# Patient Record
Sex: Male | Born: 1950
Health system: Southern US, Community
[De-identification: ages and names within clinical notes are randomized; demographics above are authoritative.]

## PROBLEM LIST (undated history)

## (undated) DIAGNOSIS — I499 Cardiac arrhythmia, unspecified: Secondary | ICD-10-CM

## (undated) DIAGNOSIS — I429 Cardiomyopathy, unspecified: Secondary | ICD-10-CM

## (undated) DIAGNOSIS — I219 Acute myocardial infarction, unspecified: Secondary | ICD-10-CM

## (undated) DIAGNOSIS — I10 Essential (primary) hypertension: Secondary | ICD-10-CM

## (undated) DIAGNOSIS — I509 Heart failure, unspecified: Secondary | ICD-10-CM

## (undated) DIAGNOSIS — Z95810 Presence of automatic (implantable) cardiac defibrillator: Secondary | ICD-10-CM

## (undated) DIAGNOSIS — T4145XA Adverse effect of unspecified anesthetic, initial encounter: Secondary | ICD-10-CM

## (undated) DIAGNOSIS — R0600 Dyspnea, unspecified: Secondary | ICD-10-CM

## (undated) DIAGNOSIS — I2510 Atherosclerotic heart disease of native coronary artery without angina pectoris: Secondary | ICD-10-CM

## (undated) DIAGNOSIS — E119 Type 2 diabetes mellitus without complications: Secondary | ICD-10-CM

## (undated) DIAGNOSIS — Z950 Presence of cardiac pacemaker: Secondary | ICD-10-CM

## (undated) DIAGNOSIS — T8859XA Other complications of anesthesia, initial encounter: Secondary | ICD-10-CM

## (undated) DIAGNOSIS — G4730 Sleep apnea, unspecified: Secondary | ICD-10-CM

## (undated) HISTORY — PX: INSERT / REPLACE / REMOVE PACEMAKER: SUR710

## (undated) HISTORY — PX: ABLATION: SHX5711

## (undated) HISTORY — PX: EYE SURGERY: SHX253

## (undated) HISTORY — PX: RETINAL DETACHMENT SURGERY: SHX105

## (undated) HISTORY — PX: JOINT REPLACEMENT: SHX530

## (undated) HISTORY — PX: CATARACT EXTRACTION: SUR2

## (undated) HISTORY — PX: CORONARY ARTERY BYPASS GRAFT: SHX141

## (undated) HISTORY — PX: FRACTURE SURGERY: SHX138

---

## 2000-10-18 ENCOUNTER — Encounter: Admission: RE | Admit: 2000-10-18 | Discharge: 2000-10-18 | Payer: Self-pay | Admitting: Sports Medicine

## 2000-10-18 ENCOUNTER — Encounter: Payer: Self-pay | Admitting: Sports Medicine

## 2009-08-04 ENCOUNTER — Ambulatory Visit: Payer: Self-pay | Admitting: Ophthalmology

## 2009-08-21 ENCOUNTER — Ambulatory Visit: Payer: Self-pay | Admitting: Family Medicine

## 2010-05-20 ENCOUNTER — Ambulatory Visit: Payer: Self-pay | Admitting: Unknown Physician Specialty

## 2011-05-25 ENCOUNTER — Ambulatory Visit: Payer: Self-pay | Admitting: Unknown Physician Specialty

## 2011-07-01 ENCOUNTER — Ambulatory Visit: Payer: Self-pay | Admitting: Cardiology

## 2014-06-28 ENCOUNTER — Ambulatory Visit: Payer: Self-pay | Admitting: Unknown Physician Specialty

## 2014-06-28 DIAGNOSIS — D122 Benign neoplasm of ascending colon: Secondary | ICD-10-CM | POA: Diagnosis not present

## 2014-06-28 DIAGNOSIS — D123 Benign neoplasm of transverse colon: Secondary | ICD-10-CM | POA: Diagnosis not present

## 2014-06-28 DIAGNOSIS — I10 Essential (primary) hypertension: Secondary | ICD-10-CM | POA: Diagnosis not present

## 2014-06-28 DIAGNOSIS — K635 Polyp of colon: Secondary | ICD-10-CM | POA: Diagnosis not present

## 2014-06-28 DIAGNOSIS — K5730 Diverticulosis of large intestine without perforation or abscess without bleeding: Secondary | ICD-10-CM | POA: Diagnosis not present

## 2014-06-28 DIAGNOSIS — D125 Benign neoplasm of sigmoid colon: Secondary | ICD-10-CM | POA: Diagnosis not present

## 2014-06-28 DIAGNOSIS — K648 Other hemorrhoids: Secondary | ICD-10-CM | POA: Diagnosis not present

## 2014-06-28 DIAGNOSIS — I252 Old myocardial infarction: Secondary | ICD-10-CM | POA: Diagnosis not present

## 2014-06-28 DIAGNOSIS — D120 Benign neoplasm of cecum: Secondary | ICD-10-CM | POA: Diagnosis not present

## 2014-06-28 DIAGNOSIS — Z1211 Encounter for screening for malignant neoplasm of colon: Secondary | ICD-10-CM | POA: Diagnosis not present

## 2014-06-28 DIAGNOSIS — D127 Benign neoplasm of rectosigmoid junction: Secondary | ICD-10-CM | POA: Diagnosis not present

## 2014-07-23 DIAGNOSIS — I252 Old myocardial infarction: Secondary | ICD-10-CM | POA: Diagnosis not present

## 2014-07-23 DIAGNOSIS — I10 Essential (primary) hypertension: Secondary | ICD-10-CM | POA: Diagnosis not present

## 2014-08-05 LAB — SURGICAL PATHOLOGY

## 2014-09-19 DIAGNOSIS — I252 Old myocardial infarction: Secondary | ICD-10-CM | POA: Diagnosis not present

## 2014-09-19 DIAGNOSIS — M5432 Sciatica, left side: Secondary | ICD-10-CM | POA: Diagnosis not present

## 2014-09-19 DIAGNOSIS — E782 Mixed hyperlipidemia: Secondary | ICD-10-CM | POA: Diagnosis not present

## 2014-09-19 DIAGNOSIS — I10 Essential (primary) hypertension: Secondary | ICD-10-CM | POA: Diagnosis not present

## 2014-09-25 DIAGNOSIS — M5432 Sciatica, left side: Secondary | ICD-10-CM | POA: Diagnosis not present

## 2014-09-30 DIAGNOSIS — M5432 Sciatica, left side: Secondary | ICD-10-CM | POA: Diagnosis not present

## 2014-10-03 DIAGNOSIS — M5432 Sciatica, left side: Secondary | ICD-10-CM | POA: Diagnosis not present

## 2014-10-08 DIAGNOSIS — M5432 Sciatica, left side: Secondary | ICD-10-CM | POA: Diagnosis not present

## 2014-10-10 DIAGNOSIS — M5432 Sciatica, left side: Secondary | ICD-10-CM | POA: Diagnosis not present

## 2014-11-06 DIAGNOSIS — I5022 Chronic systolic (congestive) heart failure: Secondary | ICD-10-CM | POA: Diagnosis not present

## 2014-11-06 DIAGNOSIS — I252 Old myocardial infarction: Secondary | ICD-10-CM | POA: Diagnosis not present

## 2014-11-06 DIAGNOSIS — I10 Essential (primary) hypertension: Secondary | ICD-10-CM | POA: Diagnosis not present

## 2014-11-06 DIAGNOSIS — I429 Cardiomyopathy, unspecified: Secondary | ICD-10-CM | POA: Diagnosis not present

## 2014-11-14 DIAGNOSIS — I5022 Chronic systolic (congestive) heart failure: Secondary | ICD-10-CM | POA: Diagnosis not present

## 2014-12-20 DIAGNOSIS — E782 Mixed hyperlipidemia: Secondary | ICD-10-CM | POA: Diagnosis not present

## 2014-12-20 DIAGNOSIS — I10 Essential (primary) hypertension: Secondary | ICD-10-CM | POA: Diagnosis not present

## 2014-12-27 DIAGNOSIS — G4730 Sleep apnea, unspecified: Secondary | ICD-10-CM | POA: Diagnosis not present

## 2014-12-27 DIAGNOSIS — I10 Essential (primary) hypertension: Secondary | ICD-10-CM | POA: Diagnosis not present

## 2014-12-27 DIAGNOSIS — E782 Mixed hyperlipidemia: Secondary | ICD-10-CM | POA: Diagnosis not present

## 2015-03-12 DIAGNOSIS — G4730 Sleep apnea, unspecified: Secondary | ICD-10-CM | POA: Diagnosis not present

## 2015-03-12 DIAGNOSIS — I5022 Chronic systolic (congestive) heart failure: Secondary | ICD-10-CM | POA: Diagnosis not present

## 2015-03-12 DIAGNOSIS — I429 Cardiomyopathy, unspecified: Secondary | ICD-10-CM | POA: Diagnosis not present

## 2015-03-12 DIAGNOSIS — E782 Mixed hyperlipidemia: Secondary | ICD-10-CM | POA: Diagnosis not present

## 2015-03-12 DIAGNOSIS — Z9889 Other specified postprocedural states: Secondary | ICD-10-CM | POA: Diagnosis not present

## 2015-03-12 DIAGNOSIS — I10 Essential (primary) hypertension: Secondary | ICD-10-CM | POA: Diagnosis not present

## 2015-04-25 DIAGNOSIS — Z125 Encounter for screening for malignant neoplasm of prostate: Secondary | ICD-10-CM | POA: Diagnosis not present

## 2015-04-25 DIAGNOSIS — Z0000 Encounter for general adult medical examination without abnormal findings: Secondary | ICD-10-CM | POA: Diagnosis not present

## 2015-04-25 DIAGNOSIS — I10 Essential (primary) hypertension: Secondary | ICD-10-CM | POA: Diagnosis not present

## 2015-04-25 DIAGNOSIS — E782 Mixed hyperlipidemia: Secondary | ICD-10-CM | POA: Diagnosis not present

## 2015-05-02 DIAGNOSIS — Z0000 Encounter for general adult medical examination without abnormal findings: Secondary | ICD-10-CM | POA: Diagnosis not present

## 2015-05-02 DIAGNOSIS — I429 Cardiomyopathy, unspecified: Secondary | ICD-10-CM | POA: Diagnosis not present

## 2015-05-02 DIAGNOSIS — M5432 Sciatica, left side: Secondary | ICD-10-CM | POA: Diagnosis not present

## 2015-05-02 DIAGNOSIS — R7303 Prediabetes: Secondary | ICD-10-CM | POA: Diagnosis not present

## 2015-05-02 DIAGNOSIS — M47816 Spondylosis without myelopathy or radiculopathy, lumbar region: Secondary | ICD-10-CM | POA: Diagnosis not present

## 2015-05-02 DIAGNOSIS — I5022 Chronic systolic (congestive) heart failure: Secondary | ICD-10-CM | POA: Diagnosis not present

## 2015-05-02 DIAGNOSIS — E782 Mixed hyperlipidemia: Secondary | ICD-10-CM | POA: Diagnosis not present

## 2015-05-04 ENCOUNTER — Emergency Department: Payer: Commercial Managed Care - HMO

## 2015-05-04 ENCOUNTER — Inpatient Hospital Stay
Admission: EM | Admit: 2015-05-04 | Discharge: 2015-05-07 | DRG: 287 | Disposition: A | Discharge status: Other Acute Inpatient Hospital | Payer: Commercial Managed Care - HMO | Attending: Internal Medicine | Admitting: Internal Medicine

## 2015-05-04 DIAGNOSIS — I248 Other forms of acute ischemic heart disease: Secondary | ICD-10-CM | POA: Diagnosis not present

## 2015-05-04 DIAGNOSIS — I252 Old myocardial infarction: Secondary | ICD-10-CM | POA: Diagnosis not present

## 2015-05-04 DIAGNOSIS — Z7982 Long term (current) use of aspirin: Secondary | ICD-10-CM

## 2015-05-04 DIAGNOSIS — I420 Dilated cardiomyopathy: Secondary | ICD-10-CM

## 2015-05-04 DIAGNOSIS — I110 Hypertensive heart disease with heart failure: Secondary | ICD-10-CM | POA: Diagnosis not present

## 2015-05-04 DIAGNOSIS — Z8249 Family history of ischemic heart disease and other diseases of the circulatory system: Secondary | ICD-10-CM

## 2015-05-04 DIAGNOSIS — Z800 Family history of malignant neoplasm of digestive organs: Secondary | ICD-10-CM | POA: Diagnosis not present

## 2015-05-04 DIAGNOSIS — R001 Bradycardia, unspecified: Secondary | ICD-10-CM | POA: Diagnosis present

## 2015-05-04 DIAGNOSIS — G4730 Sleep apnea, unspecified: Secondary | ICD-10-CM | POA: Diagnosis not present

## 2015-05-04 DIAGNOSIS — I25798 Atherosclerosis of other coronary artery bypass graft(s) with other forms of angina pectoris: Secondary | ICD-10-CM | POA: Diagnosis not present

## 2015-05-04 DIAGNOSIS — I472 Ventricular tachycardia, unspecified: Secondary | ICD-10-CM

## 2015-05-04 DIAGNOSIS — I5022 Chronic systolic (congestive) heart failure: Secondary | ICD-10-CM | POA: Diagnosis not present

## 2015-05-04 DIAGNOSIS — I10 Essential (primary) hypertension: Secondary | ICD-10-CM | POA: Diagnosis present

## 2015-05-04 DIAGNOSIS — I200 Unstable angina: Secondary | ICD-10-CM | POA: Diagnosis not present

## 2015-05-04 DIAGNOSIS — Z79899 Other long term (current) drug therapy: Secondary | ICD-10-CM

## 2015-05-04 DIAGNOSIS — E785 Hyperlipidemia, unspecified: Secondary | ICD-10-CM | POA: Diagnosis not present

## 2015-05-04 DIAGNOSIS — R079 Chest pain, unspecified: Secondary | ICD-10-CM | POA: Diagnosis not present

## 2015-05-04 DIAGNOSIS — M6250 Muscle wasting and atrophy, not elsewhere classified, unspecified site: Secondary | ICD-10-CM | POA: Diagnosis not present

## 2015-05-04 DIAGNOSIS — R7989 Other specified abnormal findings of blood chemistry: Secondary | ICD-10-CM | POA: Diagnosis not present

## 2015-05-04 DIAGNOSIS — I2510 Atherosclerotic heart disease of native coronary artery without angina pectoris: Secondary | ICD-10-CM | POA: Diagnosis present

## 2015-05-04 DIAGNOSIS — I25110 Atherosclerotic heart disease of native coronary artery with unstable angina pectoris: Secondary | ICD-10-CM | POA: Diagnosis not present

## 2015-05-04 DIAGNOSIS — I429 Cardiomyopathy, unspecified: Secondary | ICD-10-CM | POA: Diagnosis not present

## 2015-05-04 HISTORY — DX: Acute myocardial infarction, unspecified: I21.9

## 2015-05-04 HISTORY — DX: Atherosclerotic heart disease of native coronary artery without angina pectoris: I25.10

## 2015-05-04 HISTORY — DX: Heart failure, unspecified: I50.9

## 2015-05-04 HISTORY — DX: Sleep apnea, unspecified: G47.30

## 2015-05-04 HISTORY — DX: Essential (primary) hypertension: I10

## 2015-05-04 LAB — COMPREHENSIVE METABOLIC PANEL
ALK PHOS: 52 U/L (ref 38–126)
ALT: 24 U/L (ref 17–63)
ANION GAP: 8 (ref 5–15)
AST: 26 U/L (ref 15–41)
Albumin: 4.5 g/dL (ref 3.5–5.0)
BILIRUBIN TOTAL: 1.3 mg/dL — AB (ref 0.3–1.2)
BUN: 21 mg/dL — ABNORMAL HIGH (ref 6–20)
CALCIUM: 9.2 mg/dL (ref 8.9–10.3)
CO2: 22 mmol/L (ref 22–32)
Chloride: 106 mmol/L (ref 101–111)
Creatinine, Ser: 1.18 mg/dL (ref 0.61–1.24)
GFR calc non Af Amer: >60 mL/min (ref >60)
Glucose, Bld: 175 mg/dL — ABNORMAL HIGH (ref 65–99)
Potassium: 4.1 mmol/L (ref 3.5–5.1)
Sodium: 136 mmol/L (ref 135–145)
TOTAL PROTEIN: 7.5 g/dL (ref 6.5–8.1)

## 2015-05-04 LAB — GLUCOSE, CAPILLARY: Glucose-Capillary: 129 mg/dL — ABNORMAL HIGH (ref 65–99)

## 2015-05-04 LAB — CBC WITH DIFFERENTIAL/PLATELET
Basophils Absolute: 0 10*3/uL (ref 0–0.1)
Basophils Relative: 0 %
EOS ABS: 0.1 10*3/uL (ref 0–0.7)
Eosinophils Relative: 1 %
HEMATOCRIT: 41.6 % (ref 40.0–52.0)
HEMOGLOBIN: 14 g/dL (ref 13.0–18.0)
LYMPHS ABS: 2.4 10*3/uL (ref 1.0–3.6)
Lymphocytes Relative: 17 %
MCH: 31.1 pg (ref 26.0–34.0)
MCHC: 33.7 g/dL (ref 32.0–36.0)
MCV: 92.3 fL (ref 80.0–100.0)
MONO ABS: 0.4 10*3/uL (ref 0.2–1.0)
MONOS PCT: 3 %
NEUTROS ABS: 11.3 10*3/uL — AB (ref 1.4–6.5)
NEUTROS PCT: 79 %
Platelets: 262 10*3/uL (ref 150–440)
RBC: 4.5 MIL/uL (ref 4.40–5.90)
RDW: 13.5 % (ref 11.5–14.5)
WBC: 14.3 10*3/uL — ABNORMAL HIGH (ref 3.8–10.6)

## 2015-05-04 LAB — TROPONIN I
TROPONIN I: 0.03 ng/mL (ref <0.031)
Troponin I: 0.06 ng/mL — ABNORMAL HIGH (ref <0.031)

## 2015-05-04 MED ORDER — DEXTROSE 5 % IV SOLN: 150.0000 mg/h | Freq: Once | INTRAVENOUS | Status: DC

## 2015-05-04 MED ORDER — ASPIRIN 81 MG PO CHEW
324.0000 mg | CHEWABLE_TABLET | Freq: Once | ORAL | Status: AC
  Administered 2015-05-04: 324 mg via ORAL
  Filled 2015-05-04: qty 4

## 2015-05-04 MED ORDER — ACETAMINOPHEN 325 MG PO TABS: 650.0000 mg | ORAL_TABLET | Freq: Four times a day (QID) | ORAL | Status: DC | PRN

## 2015-05-04 MED ORDER — ETOMIDATE 2 MG/ML IV SOLN
20.0000 mg | Freq: Once | INTRAVENOUS | Status: AC
  Administered 2015-05-04: 15 mg via INTRAVENOUS

## 2015-05-04 MED ORDER — ONDANSETRON HCL 4 MG PO TABS: 4.0000 mg | ORAL_TABLET | Freq: Four times a day (QID) | ORAL | Status: DC | PRN

## 2015-05-04 MED ORDER — AMIODARONE HCL IN DEXTROSE 360-4.14 MG/200ML-% IV SOLN
60.0000 mg/h | INTRAVENOUS | Status: AC
  Filled 2015-05-04: qty 200

## 2015-05-04 MED ORDER — ASPIRIN EC 81 MG PO TBEC
81.0000 mg | DELAYED_RELEASE_TABLET | Freq: Every day | ORAL | Status: DC
  Administered 2015-05-05 – 2015-05-07 (×3): 81 mg via ORAL
  Filled 2015-05-04 (×4): qty 1

## 2015-05-04 MED ORDER — AMIODARONE HCL IN DEXTROSE 360-4.14 MG/200ML-% IV SOLN
INTRAVENOUS | Status: AC
  Filled 2015-05-04: qty 200

## 2015-05-04 MED ORDER — SODIUM CHLORIDE 0.9 % IJ SOLN
3.0000 mL | Freq: Two times a day (BID) | INTRAMUSCULAR | Status: DC
  Administered 2015-05-04 – 2015-05-07 (×7): 3 mL via INTRAVENOUS

## 2015-05-04 MED ORDER — DEXTROSE 5 % IV SOLN: 60.0000 mg/h | Freq: Once | INTRAVENOUS | Status: DC

## 2015-05-04 MED ORDER — HEPARIN (PORCINE) IN NACL 100-0.45 UNIT/ML-% IJ SOLN
1250.0000 [IU]/h | INTRAMUSCULAR | Status: DC
  Administered 2015-05-04 – 2015-05-06 (×3): 1250 [IU]/h via INTRAVENOUS
  Filled 2015-05-04 (×6): qty 250

## 2015-05-04 MED ORDER — ACETAMINOPHEN 650 MG RE SUPP
650.0000 mg | Freq: Four times a day (QID) | RECTAL | Status: DC | PRN
Start: 2015-05-04 — End: 2015-05-08

## 2015-05-04 MED ORDER — ETOMIDATE 2 MG/ML IV SOLN
INTRAVENOUS | Status: AC
  Administered 2015-05-04: 15 mg via INTRAVENOUS
  Filled 2015-05-04: qty 10

## 2015-05-04 MED ORDER — AMIODARONE HCL IN DEXTROSE 360-4.14 MG/200ML-% IV SOLN
15.0000 mg/h | INTRAVENOUS | Status: DC
  Administered 2015-05-04: 30 mg/h via INTRAVENOUS
  Filled 2015-05-04 (×2): qty 200

## 2015-05-04 MED ORDER — AMIODARONE IV BOLUS ONLY 150 MG/100ML
150.0000 mg | Freq: Once | INTRAVENOUS | Status: AC
  Administered 2015-05-04: 150 mg via INTRAVENOUS

## 2015-05-04 MED ORDER — PRAVASTATIN SODIUM 20 MG PO TABS
40.0000 mg | ORAL_TABLET | Freq: Every day | ORAL | Status: DC
  Administered 2015-05-05: 40 mg via ORAL
  Filled 2015-05-04: qty 2

## 2015-05-04 MED ORDER — MORPHINE SULFATE (PF) 2 MG/ML IV SOLN: 2.0000 mg | INTRAVENOUS | Status: DC | PRN

## 2015-05-04 MED ORDER — FENTANYL CITRATE (PF) 100 MCG/2ML IJ SOLN
50.0000 ug | Freq: Once | INTRAMUSCULAR | Status: AC
Start: 2015-05-04 — End: 2015-05-04
  Administered 2015-05-04: 50 ug via INTRAVENOUS

## 2015-05-04 MED ORDER — FENTANYL CITRATE (PF) 100 MCG/2ML IJ SOLN
INTRAMUSCULAR | Status: AC
  Administered 2015-05-04: 50 ug via INTRAVENOUS
  Filled 2015-05-04: qty 2

## 2015-05-04 MED ORDER — ONDANSETRON HCL 4 MG/2ML IJ SOLN: 4.0000 mg | Freq: Four times a day (QID) | INTRAMUSCULAR | Status: DC | PRN

## 2015-05-04 MED ORDER — HEPARIN BOLUS VIA INFUSION
4000.0000 [IU] | Freq: Once | INTRAVENOUS | Status: AC
  Administered 2015-05-04: 4000 [IU] via INTRAVENOUS
  Filled 2015-05-04: qty 4000

## 2015-05-04 NOTE — ED Notes (Signed)
Chest pain and heart racing

## 2015-05-04 NOTE — Progress Notes (Addendum)
Dr.Willis called after pt arrival to ICU-15 to inform him of pt HR in the 50's.  Instructed to continue to monitor pt HR and call back if pt HR sustained below 50.

## 2015-05-04 NOTE — ED Notes (Signed)
Etomidate given after Fentanyl by Verita Lamb

## 2015-05-04 NOTE — ED Notes (Signed)
Cardioversion performed with one shock. Dr. Clearnce Hasten present, Megan RN, Coralyn Mark RN, Dawn RN and Lanette Hampshire. Patient tolerated procedure well.

## 2015-05-04 NOTE — H&P (Signed)
[facility] NAME: Jay [name]    MR#:  [id number]  DATE OF BIRTH:  1950-04-24  DATE OF ADMISSION:  05/04/2015  PRIMARY CARE PHYSICIAN: No primary care provider on file.   REQUESTING/REFERRING PHYSICIAN: Clearnce Hasten, MD  CHIEF COMPLAINT:   Chief Complaint  Patient presents with  . Chest Pain    HISTORY OF PRESENT ILLNESS:  Jay [name]  is a 65 y.o. male who presents with physicians and chest pain. Patient states that he left church earlier this afternoon and by time he got home he was feeling "funny" In his chest. He then developed mild chest pain without radiation, diaphoresis, shortness of breath. He then drove himself back to church and told his wife about how he is feeling. His wife and dry. The ED. In the ED he was noted to be in V. tach, and was defibrillated which broke the rhythm. He was then started on amiodarone drip and a heparin drip given prominent ST depressions on his EKG. He has a prior history of MI, nonischemic dilated cardiomyopathy, chronic systolic congestive heart failure. He is followed by Dr. Saralyn Pilar, who is his cardiologist. Cardiology was called by the ED physician and recommended the above medications and ICU admission. Hospitalists were called for the same.  PAST MEDICAL HISTORY:   Past Medical History  Diagnosis Date  . Hypertension   . Coronary artery disease   . CHF (congestive heart failure) ([place])   . MI (myocardial infarction) ([place])   . Sleep apnea     PAST SURGICAL HISTORY:   Past Surgical History  Procedure Laterality Date  . Cataract extraction    . Retinal detachment surgery      SOCIAL HISTORY:   Social History  Substance Use Topics  . Smoking status: Never Smoker   . Smokeless tobacco: Not on file  . Alcohol Use: No    FAMILY HISTORY:   Family History  Problem Relation Age of Onset  . Stomach cancer Mother   . Heart disease Father     DRUG ALLERGIES:  No  Known Allergies  MEDICATIONS AT HOME:   Prior to Admission medications   Medication Sig Start Date End Date Taking? Authorizing Provider  aspirin EC 81 MG tablet Take 81 mg by mouth at bedtime.   Yes Historical Provider, MD  carvedilol (COREG) 6.25 MG tablet Take 6.25 mg by mouth 2 (two) times daily. 05/04/15  Yes Historical Provider, MD  Cyanocobalamin (RA VITAMIN B-12 TR) 1000 MCG TBCR Take 1,000 mcg by mouth every morning.    Yes Historical Provider, MD  ferrous sulfate 325 (65 FE) MG tablet Take 325 mg by mouth every morning.    Yes Historical Provider, MD  fluticasone (FLONASE) 50 MCG/ACT nasal spray Place 2 sprays into the nose daily as needed. For allergies. 01/08/14  Yes Historical Provider, MD  lisinopril (PRINIVIL,ZESTRIL) 5 MG tablet Take 5 mg by mouth every morning. 04/20/15  Yes Historical Provider, MD  lovastatin (MEVACOR) 20 MG tablet Take 20 mg by mouth at bedtime. 04/09/15  Yes Historical Provider, MD  lovastatin (MEVACOR) 40 MG tablet Take 40 mg by mouth at bedtime. 05/02/15  Yes Historical Provider, MD  Multiple Vitamins-Minerals (CENTRUM SILVER ULTRA MENS) TABS Take 1 tablet by mouth every morning.   Yes Historical Provider, MD  Omega 3 1000 MG CAPS Take 1,000 mg by mouth every morning.    Yes Historical Provider, MD  predniSONE (DELTASONE) 10 MG tablet Take 10-40  mg by mouth See admin instructions. Take 4 tabs (40mg ) by mouth daily x 3 days, then 3 tabs (30mg ) by mouth daily x 3 days, then 2 tabs (20mg ) by mouth daily x 3 days, then 1 tabs by mouth daily x 3 days, then stop. 05/02/15  Yes Historical Provider, MD  vitamin E 400 UNIT capsule Take 400 Units by mouth every morning.  06/24/10  Yes Historical Provider, MD    REVIEW OF SYSTEMS:  Review of Systems  Constitutional: Negative for fever, chills, weight loss and malaise/fatigue.  HENT: Negative for ear pain, hearing loss and tinnitus.   Eyes: Negative for blurred vision, double vision, pain and redness.  Respiratory:  Negative for cough, hemoptysis and shortness of breath.   Cardiovascular: Positive for chest pain and palpitations. Negative for orthopnea and leg swelling.  Gastrointestinal: Negative for nausea, vomiting, abdominal pain, diarrhea and constipation.  Genitourinary: Negative for dysuria, frequency and hematuria.  Musculoskeletal: Negative for back pain, joint pain and neck pain.  Skin:       No acne, rash, or lesions  Neurological: Negative for dizziness, tremors, focal weakness and weakness.  Endo/Heme/Allergies: Negative for polydipsia. Does not bruise/bleed easily.  Psychiatric/Behavioral: Negative for depression. The patient is not nervous/anxious and does not have insomnia.      VITAL SIGNS:   Filed Vitals:   05/04/15 1940 05/04/15 1949 05/04/15 1952 05/04/15 2000  BP:  133/111 144/97 141/88  Pulse: 82 75 72 69  Temp:      TempSrc:      Resp: 25 18 18 16   Height:      Weight:      SpO2: 99% 99% 98% 99%   Wt Readings from Last 3 Encounters:  05/04/15 95.709 kg (211 lb)    PHYSICAL EXAMINATION:  Physical Exam  Vitals reviewed. Constitutional: He is oriented to person, place, and time. He appears well-developed and well-nourished. No distress.  HENT:  Head: Normocephalic and atraumatic.  Mouth/Throat: Oropharynx is clear and moist.  Eyes: Conjunctivae and EOM are normal. Pupils are equal, round, and reactive to light. No scleral icterus.  Neck: Normal range of motion. Neck supple. No JVD present. No thyromegaly present.  Cardiovascular: Normal rate, regular rhythm and intact distal pulses.  Exam reveals no gallop and no friction rub.   No murmur heard. Respiratory: Effort normal and breath sounds normal. No respiratory distress. He has no wheezes. He has no rales.  GI: Soft. Bowel sounds are normal. He exhibits no distension. There is no tenderness.  Musculoskeletal: Normal range of motion. He exhibits no edema.  No arthritis, no gout  Lymphadenopathy:    He has no  cervical adenopathy.  Neurological: He is alert and oriented to person, place, and time. No cranial nerve deficit.  No dysarthria, no aphasia  Skin: Skin is warm and dry. No rash noted. No erythema.  Psychiatric: He has a normal mood and affect. His behavior is normal. Judgment and thought content normal.    LABORATORY PANEL:   CBC  Recent Labs Lab 05/04/15 1901  WBC 14.3*  HGB 14.0  HCT 41.6  PLT 262   ------------------------------------------------------------------------------------------------------------------  Chemistries   Recent Labs Lab 05/04/15 1901  NA 136  K 4.1  CL 106  CO2 22  GLUCOSE 175*  BUN 21*  CREATININE 1.18  CALCIUM 9.2  AST 26  ALT 24  ALKPHOS 52  BILITOT 1.3*   ------------------------------------------------------------------------------------------------------------------  Cardiac Enzymes  Recent Labs Lab 05/04/15 1901  TROPONINI 0.03   ------------------------------------------------------------------------------------------------------------------  RADIOLOGY:  Dg Chest 1 View  05/04/2015  CLINICAL DATA:  Chest pain EXAM: CHEST 1 VIEW COMPARISON:  None. FINDINGS: Hyperinflation. Borderline cardiomegaly. Negative aortic and hilar contours. There is no edema, consolidation, effusion, or pneumothorax. IMPRESSION: 1. No acute finding. 2. Hyperinflation. Electronically Signed   By: Monte Fantasia M.D.   On: 05/04/2015 20:04    EKG:   Orders placed or performed during the hospital encounter of 05/04/15  . EKG 12-Lead  . EKG 12-Lead    IMPRESSION AND PLAN:  Principal Problem:   V-tach ([place]) - currently in sinus rhythm with a controlled rate. He is on an amiodarone drip, as well as a heparin drip. We'll continue these medications and admitted to ICU. Consult cardiology. Active Problems:   CAD (coronary artery disease) - continue home medications for this, except for those that might further lower his heart rate as he is currently  on amiodarone drip and has a well-controlled rate. He does have ST depressions on his EKG, it is unknown with no prior for comparison if these are chronic or acute. He is on a heparin drip. We will keep him nothing by mouth after midnight just in case he needs any cardiac procedure.   Nonischemic dilated cardiomyopathy ([place]) - continue home medications and defer to cardiology's recommendations as above.   HTN (hypertension) - stable, at goal, hold home antihypertensives at this time unless his blood pressure rises.   Chronic systolic CHF (congestive heart failure) ([facility]) - continue home meds for this, does not seem to be in acute exacerbation, cardiology consult as above.  All the records are reviewed and case discussed with ED provider. Management plans discussed with the patient and/or family.  DVT PROPHYLAXIS: Systemic anticoagulation  GI PROPHYLAXIS: None  ADMISSION STATUS: Inpatient, Observation  CODE STATUS: Full Code Status History    This patient does not have a recorded code status. Please follow your organizational policy for patients in this situation.      TOTAL CRITICAL CARE TIME TAKING CARE OF THIS PATIENT: 45 minutes.    Reshard Guillet Sheldon 05/04/2015, 8:47 PM  [facility]  Office  [phone]  CC: Primary care physician; No primary care provider on file.

## 2015-05-04 NOTE — Progress Notes (Signed)
Dr.Willis called to verify pt Amiodarone drip rate of 30 mg/hr, as reported by ED RN, prior to pts arrival.  Per Dr.Willis leave pt at 30mg /hr since pt rate and rhythm are WNL.

## 2015-05-04 NOTE — ED Provider Notes (Signed)
[facility] Emergency Department Provider Note  ____________________________________________  Time seen: Seen upon arrival to the treatment area.  I have reviewed the triage vital signs and the nursing notes.   HISTORY  Chief Complaint Chest Pain    HPI Jay [name] is a 65 y.o. male with a history of hypertension and CHF who is presenting today with sudden onset chest pain at 6:10 PM. He says that the chest pain is a 4 out of 10 and to the left lower area of his chest. It is nonradiating. It is not associated with any shortness of breath, nausea or vomiting. He says the pain is a pressure-like pain. He denies being on any blood thinners.   Past Medical History  Diagnosis Date  . Hypertension   . Coronary artery disease   . CHF (congestive heart failure) ([place])   . MI (myocardial infarction) ([place])     There are no active problems to display for this patient.   History reviewed. No pertinent past surgical history.  No current outpatient prescriptions on file.  Allergies Review of patient's allergies indicates no known allergies.  No family history on file.  Social History Social History  Substance Use Topics  . Smoking status: Never Smoker   . Smokeless tobacco: None  . Alcohol Use: No    Review of Systems Constitutional: No fever/chills Eyes: No visual changes. ENT: No sore throat. Cardiovascular: As above Respiratory: Denies shortness of breath. Gastrointestinal: No abdominal pain.  No nausea, no vomiting.  No diarrhea.  No constipation. Genitourinary: Negative for dysuria. Musculoskeletal: Negative for back pain. Skin: Negative for rash. Neurological: Negative for headaches, focal weakness or numbness.  10-point ROS otherwise negative.  ____________________________________________   PHYSICAL EXAM:  VITAL SIGNS: ED Triage Vitals  Enc Vitals Group     BP 05/04/15 1852 129/107 mmHg     Pulse Rate 05/04/15 1932 91   Resp 05/04/15 1852 17     Temp 05/04/15 1852 97.6 F (36.4 C)     Temp Source 05/04/15 1852 Oral     SpO2 05/04/15 1852 98 %     Weight 05/04/15 1852 211 lb (95.709 kg)     Height 05/04/15 1852 6\' 1"  (1.854 m)     Head Cir --      Peak Flow --      Pain Score 05/04/15 1855 4     Pain Loc --      Pain Edu? --      Excl. in [place]? --     Constitutional: Alert and oriented. Well appearing and in no acute distress. Eyes: Conjunctivae are normal. PERRL. EOMI. Head: Atraumatic. Nose: No congestion/rhinnorhea. Mouth/Throat: Mucous membranes are moist.  Oropharynx non-erythematous. Neck: No stridor.   Cardiovascular: Tachycardic, regular rhythm. Grossly normal heart sounds.  Good peripheral circulation. Respiratory: Normal respiratory effort.  No retractions. Lungs CTAB. Gastrointestinal: Soft and nontender. No distention. No abdominal bruits. No CVA tenderness. Musculoskeletal: No lower extremity tenderness nor edema.  No joint effusions. Neurologic:  Normal speech and language. No gross focal neurologic deficits are appreciated.  Skin:  Skin is warm, dry and intact. No rash noted. Psychiatric: Mood and affect are normal. Speech and behavior are normal.  ____________________________________________   LABS (all labs ordered are listed, but only abnormal results are displayed)  Labs Reviewed  CBC WITH DIFFERENTIAL/PLATELET - Abnormal; Notable for the following:    WBC 14.3 (*)    Neutro Abs 11.3 (*)    All other components within  normal limits  COMPREHENSIVE METABOLIC PANEL - Abnormal; Notable for the following:    Glucose, Bld 175 (*)    BUN 21 (*)    Total Bilirubin 1.3 (*)    All other components within normal limits  TROPONIN I   ____________________________________________  EKG  ED ECG REPORT I, Doran Stabler, the attending physician, personally viewed and interpreted this ECG.   Date: 05/04/2015  EKG Time: 1844  Rate: 219  Rhythm: Ventricular tachycardia   Axis: Rightward  Intervals:none  ST&T Change: Deferred secondary to ventricular tachycardia  ED ECG REPORT I, [place],  Youlanda Roys, the attending physician, personally viewed and interpreted this ECG.   Date: 05/04/2015  EKG Time: 1925  Rate: 79  Rhythm: normal sinus rhythm  Axis: Normal  Intervals:none  ST&T Change: Diffuse ST depression with ventricular bigeminy. No abnormal T-wave inversion. Poor baseline we'll repeat.  ED ECG REPORT I, Doran Stabler, the attending physician, personally viewed and interpreted this ECG.   Date: 05/04/2015  EKG Time: 1929  Rate: 87  Rhythm: normal sinus rhythm  Axis: Normal  Intervals:none  ST&T Change: 1 mm ST depression in 2, 3, aVF, V4 through 6. No abnormal T-wave inversions. Minimal elevation in aVR.    ____________________________________________  RADIOLOGY  No acute findings on the chest x-ray ____________________________________________   PROCEDURES  CRITICAL CARE Performed by: Doran Stabler   Total critical care time: 35 minutes  Critical care time was exclusive of separately billable procedures and treating other patients.  Critical care was necessary to treat or prevent imminent or life-threatening deterioration.  Critical care was time spent personally by me on the following activities: development of treatment plan with patient and/or surrogate as well as nursing, discussions with consultants, evaluation of patient's response to treatment, examination of patient, obtaining history from patient or surrogate, ordering and performing treatments and interventions, ordering and review of laboratory studies, ordering and review of radiographic studies, pulse oximetry and re-evaluation of patient's condition.  ELECTRIC CARDIOVERSION Performed by: Doran Stabler Consent:  The procedure was performed in an emergent situation.  Verbal consent was obtained.  Written consent was not obtained. Risks and  benefits: risks, benefits and alternatives were discussed Consent given by: patient Patient understanding: patient states understanding of the procedure being performed Patient consent: the patient's understanding of the procedure matches consent given Required items: required blood products, implants, devices, and special equipment available Patient identity confirmed: verbally with patient and with wristband Patient sedated: yes Sedation type: moderate (conscious) sedation Sedatives: etomidate Analgesia: fentanyl Cardioversion basis: emergent Pre-procedure rhythm: Ventricular tachycardia Patient position: patient was placed in a supine position Chest area: chest area exposed Electrodes: pads Electrodes placed: anterior-posterior Number of attempts: 1 Attempt 1 mode: synchronous Attempt 1 waveform: biphasic Attempt 1 shock (in Joules): 100 Attempt 1 outcome: conversion to normal sinus rhythm Post-procedure rhythm: normal sinus rhythm Complications: no complications Patient tolerance: Patient tolerated the procedure well with no immediate complications   Procedural sedation Performed by: Doran Stabler Consent: Verbal consent obtained. Risks and benefits: risks, benefits and alternatives were discussed Required items: required blood products, implants, devices, and special equipment available Patient identity confirmed: arm band and provided demographic data Time out: Immediately prior to procedure a "time out" was called to verify the correct patient, procedure, equipment, support staff and site/side marked as required.  Sedation type: moderate (conscious) sedation NPO time confirmed and considedered  Sedatives: ETOMIDATE  Physician Time at Bedside: 35 minutes  Vitals: Vital signs were monitored during sedation. Cardiac Monitor, pulse oximeter Patient tolerance: Patient tolerated the procedure well with no immediate complications. Comments: Pt with uneventful  recovered. Returned to pre-procedural sedation baseline    ____________________________________________   INITIAL IMPRESSION / ASSESSMENT AND PLAN / ED COURSE  Pertinent labs & imaging results that were available during my care of the patient were reviewed by me and considered in my medical decision making (see chart for details).  ----------------------------------------- 8:15 PM on 05/04/2015 -----------------------------------------  Patient presented stable to the emergency department with a pressure above [id number] systolic and with an intact mental status. He was started with a bolus of 150 mg of amiodarone after 10 minutes and then started on an infusion. However, after about 12 minutes he was not showing any progress to cardioversion. At that time I proceeded to electrical cardioversion. The patient was sedated and the cardioversion was successful. After the procedure the patient denies any complaints. I discussed the case with Dr. Clayborn Bigness who recommends that the patient be admitted to the hospital and started on a heparin drip as well as the amiodarone continued. I explained the plan to the patient as well as his family. They are understanding and will comply. Signed out to Dr.[place].   ____________________________________________   FINAL CLINICAL IMPRESSION(S) / ED DIAGNOSES  Chest pain. Ventricular tachycardia.    Orbie Pyo, MD 05/04/15 2016

## 2015-05-04 NOTE — Progress Notes (Addendum)
ANTICOAGULATION CONSULT NOTE - Initial Consult  Pharmacy Consult for Heparin  Indication: chest pain/ACS  No Known Allergies  Patient Measurements: Height: 6\' 1"  (185.4 cm) Weight: 211 lb (95.709 kg) IBW/kg (Calculated) : 79.9 Heparin Dosing Weight:  95.7 kg   Vital Signs: Temp: 97.6 F (36.4 C) (01/22 1852) Temp Source: Oral (01/22 1852) BP: 156/110 mmHg (01/22 1935) Pulse Rate: 82 (01/22 1940)  Labs:  Recent Labs  05/04/15 1901  HGB 14.0  HCT 41.6  PLT 262  CREATININE 1.18  TROPONINI 0.03    Estimated Creatinine Clearance: 70.5 mL/min (by C-G formula based on Cr of 1.18).   Medical History: Past Medical History  Diagnosis Date  . Hypertension   . Coronary artery disease   . CHF (congestive heart failure) ([place])   . MI (myocardial infarction) ([place])     Medications:   (Not in a hospital admission)  Assessment: Pharmacy consulted to dose heparin in this 65 year old male admitted with ACS. CrCl = 70.5 ml/min No prior anticoag noted.   Goal of Therapy:  Heparin level 0.3-0.7 units/ml Monitor platelets by anticoagulation protocol: Yes   Plan:  Give 4000 units bolus x 1 Start heparin infusion at 1250  units/hr  Will order baseline aPTT and INR. Will check HL 6 hrs after start of drip on 1/23 @ 0200.   Zamarion Longest D 05/04/2015,7:47 PM

## 2015-05-05 ENCOUNTER — Inpatient Hospital Stay
Admit: 2015-05-05 | Discharge: 2015-05-05 | Disposition: A | Discharge status: Home / Self Care | Payer: Commercial Managed Care - HMO | Attending: Internal Medicine | Admitting: Internal Medicine

## 2015-05-05 LAB — BASIC METABOLIC PANEL
ANION GAP: 5 (ref 5–15)
BUN: 18 mg/dL (ref 6–20)
CALCIUM: 8.6 mg/dL — AB (ref 8.9–10.3)
CHLORIDE: 108 mmol/L (ref 101–111)
CO2: 26 mmol/L (ref 22–32)
Creatinine, Ser: 0.95 mg/dL (ref 0.61–1.24)
GFR calc non Af Amer: >60 mL/min (ref >60)
Glucose, Bld: 120 mg/dL — ABNORMAL HIGH (ref 65–99)
Potassium: 4.1 mmol/L (ref 3.5–5.1)
SODIUM: 139 mmol/L (ref 135–145)

## 2015-05-05 LAB — HEPARIN LEVEL (UNFRACTIONATED)
HEPARIN UNFRACTIONATED: 0.43 [IU]/mL (ref 0.30–0.70)
Heparin Unfractionated: 0.48 IU/mL (ref 0.30–0.70)
Heparin Unfractionated: 1.45 IU/mL — ABNORMAL HIGH (ref 0.30–0.70)

## 2015-05-05 LAB — CBC
HCT: 36.4 % — ABNORMAL LOW (ref 40.0–52.0)
HEMOGLOBIN: 12.4 g/dL — AB (ref 13.0–18.0)
MCH: 31.5 pg (ref 26.0–34.0)
MCHC: 34.1 g/dL (ref 32.0–36.0)
MCV: 92.3 fL (ref 80.0–100.0)
PLATELETS: 188 10*3/uL (ref 150–440)
RBC: 3.94 MIL/uL — AB (ref 4.40–5.90)
RDW: 13.7 % (ref 11.5–14.5)
WBC: 11.9 10*3/uL — AB (ref 3.8–10.6)

## 2015-05-05 LAB — MRSA PCR SCREENING: MRSA BY PCR: NEGATIVE

## 2015-05-05 LAB — TROPONIN I
Troponin I: 0.07 ng/mL — ABNORMAL HIGH (ref <0.031)
Troponin I: 0.07 ng/mL — ABNORMAL HIGH (ref <0.031)

## 2015-05-05 MED ORDER — AMIODARONE HCL IN DEXTROSE 360-4.14 MG/200ML-% IV SOLN
10.0000 mg/h | INTRAVENOUS | Status: DC
  Administered 2015-05-05: 10 mg/h via INTRAVENOUS

## 2015-05-05 NOTE — Progress Notes (Signed)
Dr.Willis informed of pt HR continuing to be below 50.  No new orders at this time, will continue to monitor HR closely.

## 2015-05-05 NOTE — Progress Notes (Signed)
Dr.Willis informed of pt HR remaining below 50, amiodarone adjusted to 20mg /hr.  Will continue to monitor closely.

## 2015-05-05 NOTE — Progress Notes (Signed)
*  PRELIMINARY RESULTS* Echocardiogram 2D Echocardiogram has been performed.  Jay [name] Stills 05/05/2015, 9:42 PM

## 2015-05-05 NOTE — Progress Notes (Signed)
Dr.Willis informed of pt Troponin level of 0.06.  No new orders at this time, will continue to monitor.

## 2015-05-05 NOTE — Progress Notes (Signed)
Dr.Pyreddy informed of pt situation and pts continued lowering HR.  Instructed to continue to monitor pt HR and decrease amiodarone to 10mg /hr if pts HR drops below 40.

## 2015-05-05 NOTE — Progress Notes (Signed)
[facility] [place] NAME: Orlando Muston    MR#:  [id number]  DATE OF BIRTH:  [birth year]  SUBJECTIVE:  CHIEF COMPLAINT:   Chief Complaint  Patient presents with  . Chest Pain  no further chest pain, very comfortable, rate in much better control. Wife at bedside. REVIEW OF SYSTEMS:  Review of Systems  Constitutional: Negative for fever, weight loss, malaise/fatigue and diaphoresis.  HENT: Negative for ear discharge, ear pain, hearing loss, nosebleeds, sore throat and tinnitus.   Eyes: Negative for blurred vision and pain.  Respiratory: Negative for cough, hemoptysis, shortness of breath and wheezing.   Cardiovascular: Negative for chest pain, palpitations, orthopnea and leg swelling.  Gastrointestinal: Negative for heartburn, nausea, vomiting, abdominal pain, diarrhea, constipation and blood in stool.  Genitourinary: Negative for dysuria, urgency and frequency.  Musculoskeletal: Negative for myalgias and back pain.  Skin: Negative for itching and rash.  Neurological: Negative for dizziness, tingling, tremors, focal weakness, seizures, weakness and headaches.  Psychiatric/Behavioral: Negative for depression. The patient is not nervous/anxious.    DRUG ALLERGIES:  No Known Allergies VITALS:  Blood pressure 132/66, pulse 62, temperature 98.1 F (36.7 C), temperature source Oral, resp. rate 15, height 6\' 1"  (1.854 m), weight 96.3 kg (212 lb 4.9 oz), SpO2 96 %. PHYSICAL EXAMINATION:  Physical Exam  Constitutional: He is oriented to person, place, and time and well-developed, well-nourished, and in no distress.  HENT:  Head: Normocephalic and atraumatic.  Eyes: Conjunctivae and EOM are normal. Pupils are equal, round, and reactive to light.  Neck: Normal range of motion. Neck supple. No tracheal deviation present. No thyromegaly present.  Cardiovascular: Normal rate, regular rhythm and normal heart sounds.   Pulmonary/Chest: Effort  normal and breath sounds normal. No respiratory distress. He has no wheezes. He exhibits no tenderness.  Abdominal: Soft. Bowel sounds are normal. He exhibits no distension. There is no tenderness.  Musculoskeletal: Normal range of motion.  Neurological: He is alert and oriented to person, place, and time. No cranial nerve deficit.  Skin: Skin is warm and dry. No rash noted.  Psychiatric: Mood and affect normal.   LABORATORY PANEL:   CBC  Recent Labs Lab 05/05/15 0431  WBC 11.9*  HGB 12.4*  HCT 36.4*  PLT 188   ------------------------------------------------------------------------------------------------------------------ Chemistries   Recent Labs Lab 05/04/15 1901 05/05/15 0431  NA 136 139  K 4.1 4.1  CL 106 108  CO2 22 26  GLUCOSE 175* 120*  BUN 21* 18  CREATININE 1.18 0.95  CALCIUM 9.2 8.6*  AST 26  --   ALT 24  --   ALKPHOS 52  --   BILITOT 1.3*  --    RADIOLOGY:  Dg Chest 1 View  05/04/2015  CLINICAL DATA:  Chest pain EXAM: CHEST 1 VIEW COMPARISON:  None. FINDINGS: Hyperinflation. Borderline cardiomegaly. Negative aortic and hilar contours. There is no edema, consolidation, effusion, or pneumothorax. IMPRESSION: 1. No acute finding. 2. Hyperinflation. Electronically Signed   By: Monte Fantasia M.D.   On: 05/04/2015 20:04   ASSESSMENT AND PLAN:   * V-tach [facility]) - currently in sinus rhythm with a controlled rate. continue amiodarone drip, can transfer to Tele now. Cardiology planning cath in am.  * CAD (coronary artery disease) - cath in am, appreciate cardio input. Continue statin, asa.  * Nonischemic dilated cardiomyopathy ([place]) - continue home medications.  * HTN (hypertension) - stable, at goal, hold home antihypertensives at this time unless his blood pressure  rises.  * Chronic systolic CHF (congestive heart failure) ([place]) - continue home meds for this, does not seem to be in acute exacerbation.     All the records are reviewed and case discussed  with Care Management/Social Worker. Management plans discussed with the patient, family and they are in agreement.  CODE STATUS: FULL CODE  TOTAL TIME TAKING CARE OF THIS PATIENT: 35 minutes.   More than 50% of the time was spent in counseling/coordination of care: YES (d/w wife and Dr Nehemiah Massed).  POSSIBLE D/C IN 1-2 DAYS, DEPENDING ON CLINICAL CONDITION and CATH results.   [facility], Daiwik Buffalo M.D on 05/05/2015 at 5:04 PM  Between 7am to 6pm - Pager - [phone]  After 6pm go to [email or web address] - password [email or web address] [facility]  [place] [facility]  Office  [phone]  CC: Primary care physician; No primary care provider on file.  Note: This dictation was prepared with Dragon dictation along with smaller phrase technology. Any transcriptional errors that result from this process are unintentional.

## 2015-05-05 NOTE — Progress Notes (Signed)
ANTICOAGULATION CONSULT NOTE - Initial Consult  Pharmacy Consult for Heparin  Indication: chest pain/ACS  No Known Allergies  Patient Measurements: Height: 6\' 1"  (185.4 cm) Weight: 212 lb 4.9 oz (96.3 kg) IBW/kg (Calculated) : 79.9 Heparin Dosing Weight:  95.7 kg   Vital Signs: Temp: 98.2 F (36.8 C) (01/22 2200) Temp Source: Oral (01/22 2200) BP: 124/64 mmHg (01/23 0200) Pulse Rate: 54 (01/23 0200)  Labs:  Recent Labs  05/04/15 1901 05/04/15 2250 05/05/15 0146  HGB 14.0  --   --   HCT 41.6  --   --   PLT 262  --   --   HEPARINUNFRC  --   --  0.48  CREATININE 1.18  --   --   TROPONINI 0.03 0.06*  --     Estimated Creatinine Clearance: 76.4 mL/min (by C-G formula based on Cr of 1.18).   Medical History: Past Medical History  Diagnosis Date  . Hypertension   . Coronary artery disease   . CHF (congestive heart failure) ([place])   . MI (myocardial infarction) ([place])   . Sleep apnea     Medications:  Prescriptions prior to admission  Medication Sig Dispense Refill Last Dose  . aspirin EC 81 MG tablet Take 81 mg by mouth at bedtime.   05/04/2015 at Unknown time  . carvedilol (COREG) 6.25 MG tablet Take 6.25 mg by mouth 2 (two) times daily.   05/04/2015 at 0730  . Cyanocobalamin (RA VITAMIN B-12 TR) 1000 MCG TBCR Take 1,000 mcg by mouth every morning.    05/04/2015 at Unknown time  . ferrous sulfate 325 (65 FE) MG tablet Take 325 mg by mouth every morning.    05/04/2015 at Unknown time  . fluticasone (FLONASE) 50 MCG/ACT nasal spray Place 2 sprays into the nose daily as needed. For allergies.   Past Month at Unknown time  . lisinopril (PRINIVIL,ZESTRIL) 5 MG tablet Take 5 mg by mouth every morning.   05/04/2015 at Unknown time  . lovastatin (MEVACOR) 20 MG tablet Take 20 mg by mouth at bedtime.   05/03/2015 at Unknown time  . lovastatin (MEVACOR) 40 MG tablet Take 40 mg by mouth at bedtime.   haven't started yet  . Multiple Vitamins-Minerals (CENTRUM SILVER ULTRA MENS) TABS  Take 1 tablet by mouth every morning.   05/04/2015 at Unknown time  . Omega 3 1000 MG CAPS Take 1,000 mg by mouth every morning.    05/04/2015 at Unknown time  . predniSONE (DELTASONE) 10 MG tablet Take 10-40 mg by mouth See admin instructions. Take 4 tabs (40mg ) by mouth daily x 3 days, then 3 tabs (30mg ) by mouth daily x 3 days, then 2 tabs (20mg ) by mouth daily x 3 days, then 1 tabs by mouth daily x 3 days, then stop.   05/04/2015 at Unknown time  . vitamin E 400 UNIT capsule Take 400 Units by mouth every morning.    05/04/2015 at Unknown time    Assessment: Pharmacy consulted to dose heparin in this 65 year old male admitted with ACS. CrCl = 70.5 ml/min No prior anticoag noted.   Goal of Therapy:  Heparin level 0.3-0.7 units/ml Monitor platelets by anticoagulation protocol: Yes   Plan:  Heparin level therapeutic. Continue current rate. Will confirm level in 6 hours.  Laural Benes, Pharm.D., BCPS Clinical Pharmacist 05/05/2015,2:53 AM

## 2015-05-05 NOTE — Consult Note (Signed)
[facility] Consultation Note  Patient ID: Jay [name], MRN: [id number], DOB/AGE: [birth year] 65 y.o. Admit date: 05/04/2015   Date of Consult: 05/05/2015 Primary Physician: No primary care provider on file. Primary Cardiologist: Sim Boast O's  Chief Complaint:  Chief Complaint  Patient presents with  . Chest Pain   Reason for Consult: ventricular tachycardia  HPI: 65 y.o. male with the known apparent on a previous myocardial infarction LV systolic dysfunction essential hypertension sleep apnea and coronary artery disease who is had no evidence of significant issues on appropriate medication management for which showed the patient had new onset chest discomfort shortness of breath and palpitations. He was seen in the emergency room with these unrelenting palpitations and found to have ventricular tachycardia at approximately 160 bpm. The patient had electrical cardioversion to normal rhythm with complete resolution of chest pain shortness of breath and all of his symptoms and was placed on amiodarone drip. The patient has remained on amiodarone drip with only minimal bradycardia and no evidence of recurrent evidence of atrial fibrillation or ventricular tachycardia. The patient did have an elevation of troponin of 0.07 consistent with demand ischemia but no evidence of apparent myocardial infarction at this time. Currently he feels much better. He is on appropriate medication management for hypertension control as well as treatment for sleep apnea  Past Medical History  Diagnosis Date  . Hypertension   . Coronary artery disease   . CHF (congestive heart failure) ([place])   . MI (myocardial infarction) ([place])   . Sleep apnea       Surgical History:  Past Surgical History  Procedure Laterality Date  . Cataract extraction    . Retinal detachment surgery       Home Meds: Prior to Admission medications   Medication Sig Start Date End Date Taking? Authorizing Provider   aspirin EC 81 MG tablet Take 81 mg by mouth at bedtime.   Yes Historical Provider, MD  carvedilol (COREG) 6.25 MG tablet Take 6.25 mg by mouth 2 (two) times daily. 05/04/15  Yes Historical Provider, MD  Cyanocobalamin (RA VITAMIN B-12 TR) 1000 MCG TBCR Take 1,000 mcg by mouth every morning.    Yes Historical Provider, MD  ferrous sulfate 325 (65 FE) MG tablet Take 325 mg by mouth every morning.    Yes Historical Provider, MD  fluticasone (FLONASE) 50 MCG/ACT nasal spray Place 2 sprays into the nose daily as needed. For allergies. 01/08/14  Yes Historical Provider, MD  lisinopril (PRINIVIL,ZESTRIL) 5 MG tablet Take 5 mg by mouth every morning. 04/20/15  Yes Historical Provider, MD  lovastatin (MEVACOR) 20 MG tablet Take 20 mg by mouth at bedtime. 04/09/15  Yes Historical Provider, MD  lovastatin (MEVACOR) 40 MG tablet Take 40 mg by mouth at bedtime. 05/02/15  Yes Historical Provider, MD  Multiple Vitamins-Minerals (CENTRUM SILVER ULTRA MENS) TABS Take 1 tablet by mouth every morning.   Yes Historical Provider, MD  Omega 3 1000 MG CAPS Take 1,000 mg by mouth every morning.    Yes Historical Provider, MD  predniSONE (DELTASONE) 10 MG tablet Take 10-40 mg by mouth See admin instructions. Take 4 tabs (40mg ) by mouth daily x 3 days, then 3 tabs (30mg ) by mouth daily x 3 days, then 2 tabs (20mg ) by mouth daily x 3 days, then 1 tabs by mouth daily x 3 days, then stop. 05/02/15  Yes Historical Provider, MD  vitamin E 400 UNIT capsule Take 400 Units by mouth every morning.  06/24/10  Yes Historical Provider, MD    Inpatient Medications:  . aspirin EC  81 mg Oral QHS  . pravastatin  40 mg Oral q1800  . sodium chloride  3 mL Intravenous Q12H   . amiodarone 10 mg/hr (05/05/15 0926)  . heparin 1,250 Units/hr (05/05/15 1143)    Allergies: No Known Allergies  Social History   Social History  . Marital Status: Married    Spouse Name: N/A  . Number of Children: N/A  . Years of Education: N/A   Occupational  History  . Not on file.   Social History Main Topics  . Smoking status: Never Smoker   . Smokeless tobacco: Not on file  . Alcohol Use: No  . Drug Use: No  . Sexual Activity: Not on file   Other Topics Concern  . Not on file   Social History Narrative     Family History  Problem Relation Age of Onset  . Stomach cancer Mother   . Heart disease Father      Review of Systems Positive for chest discomfort shortness of breath Negative for: General:  chills, fever, night sweats or weight changes.  Cardiovascular: PND orthopnea syncope dizziness  Dermatological skin lesions rashes Respiratory: Cough congestion Urologic: Frequent urination urination at night and hematuria Abdominal: negative for nausea, vomiting, diarrhea, bright red blood per rectum, melena, or hematemesis Neurologic: negative for visual changes, and/or hearing changes  All other systems reviewed and are otherwise negative except as noted above.  Labs:  Recent Labs  05/04/15 1901 05/04/15 2250 05/05/15 0431 05/05/15 1043  TROPONINI 0.03 0.06* 0.07* 0.07*   Lab Results  Component Value Date   WBC 11.9* 05/05/2015   HGB 12.4* 05/05/2015   HCT 36.4* 05/05/2015   MCV 92.3 05/05/2015   PLT 188 05/05/2015    Recent Labs Lab 05/04/15 1901 05/05/15 0431  NA 136 139  K 4.1 4.1  CL 106 108  CO2 22 26  BUN 21* 18  CREATININE 1.18 0.95  CALCIUM 9.2 8.6*  PROT 7.5  --   BILITOT 1.3*  --   ALKPHOS 52  --   ALT 24  --   AST 26  --   GLUCOSE 175* 120*   No results found for: CHOL, HDL, LDLCALC, TRIG No results found for: DDIMER  Radiology/Studies:  Dg Chest 1 View  05/04/2015  CLINICAL DATA:  Chest pain EXAM: CHEST 1 VIEW COMPARISON:  None. FINDINGS: Hyperinflation. Borderline cardiomegaly. Negative aortic and hilar contours. There is no edema, consolidation, effusion, or pneumothorax. IMPRESSION: 1. No acute finding. 2. Hyperinflation. Electronically Signed   By: Monte Fantasia M.D.   On:  05/04/2015 20:04    EKG: Tachycardia with rapid ventricular rate  Weights: Filed Weights   05/04/15 1852 05/04/15 2200  Weight: 211 lb (95.709 kg) 212 lb 4.9 oz (96.3 kg)     Physical Exam: Blood pressure 132/66, pulse 62, temperature 98.1 F (36.7 C), temperature source Oral, resp. rate 15, height 6\' 1"  (1.854 m), weight 212 lb 4.9 oz (96.3 kg), SpO2 96 %. Body mass index is 28.02 kg/(m^2). General: Well developed, well nourished, in no acute distress. Head eyes ears nose throat: Normocephalic, atraumatic, sclera non-icteric, no xanthomas, nares are without discharge. No apparent thyromegaly and/or mass  Lungs: Normal respiratory effort.  no wheezes, no rales, no rhonchi.  Heart: RRR with normal S1 S2. no murmur gallop, no rub, PMI is normal size and placement, carotid upstroke normal without bruit, jugular venous pressure is normal Abdomen:  Soft, non-tender, non-distended with normoactive bowel sounds. No hepatomegaly. No rebound/guarding. No obvious abdominal masses. Abdominal aorta is normal size without bruit Extremities: No edema. no cyanosis, no clubbing, no ulcers  Peripheral : 2+ bilateral upper extremity pulses, 2+ bilateral femoral pulses, 2+ bilateral dorsal pedal pulse Neuro: Alert and oriented. No facial asymmetry. No focal deficit. Moves all extremities spontaneously. Musculoskeletal: Normal muscle tone without kyphosis Psych:  Responds to questions appropriately with a normal affect.    Assessment: 65 year old male with apparent previous myocardial infarction hypertension hyperlipidemia sleep apnea with acute on set of ventricular tachycardia with rapid ventricular rate and elevated troponin  Plan: 1. Continue amiodarone drip at this time and changed to amiodarone orally 400 mg twice per day 2. Echocardiogram for LV systolic dysfunction valvular heart disease contributing to above 3. Proceed to cardiac catheterization for LV systolic dysfunction congestive heart  failure and ventricular tachycardia with elevated troponin. The patient understands the risk and benefits of cardiac catheterization. This includes a possibility of death stroke heart attack infection bleeding or blood clot. The patient is at low risk for conscious sedation  Signed, Corey Skains M.D. [facility] 05/05/2015, 5:12 PM

## 2015-05-05 NOTE — Progress Notes (Signed)
Dr.Willis informed of pt continued HR below 50, amiodarone changed to 15mg /hr.  Will continue to monitor.

## 2015-05-05 NOTE — Progress Notes (Signed)
Troponin level of 0.07 reported to Dr.Pyreddy.  No new orders at this time will continue to monitor.

## 2015-05-05 NOTE — Progress Notes (Signed)
ANTICOAGULATION CONSULT NOTE - Initial Consult  Pharmacy Consult for Heparin  Indication: chest pain/ACS  No Known Allergies  Patient Measurements: Height: 6\' 1"  (185.4 cm) Weight: 212 lb 4.9 oz (96.3 kg) IBW/kg (Calculated) : 79.9 Heparin Dosing Weight:  95.7 kg   Vital Signs: Temp: 98.1 F (36.7 C) (01/23 1200) Temp Source: Oral (01/23 1200) BP: 132/75 mmHg (01/23 1200) Pulse Rate: 50 (01/23 1200)  Labs:  Recent Labs  05/04/15 1901 05/04/15 2250 05/05/15 0146 05/05/15 0431 05/05/15 0815 05/05/15 1043 05/05/15 1132  HGB 14.0  --   --  12.4*  --   --   --   HCT 41.6  --   --  36.4*  --   --   --   PLT 262  --   --  188  --   --   --   HEPARINUNFRC  --   --  0.48  --  1.45*  --  0.43  CREATININE 1.18  --   --  0.95  --   --   --   TROPONINI 0.03 0.06*  --  0.07*  --  0.07*  --     Estimated Creatinine Clearance: 94.8 mL/min (by C-G formula based on Cr of 0.95).   Medical History: Past Medical History  Diagnosis Date  . Hypertension   . Coronary artery disease   . CHF (congestive heart failure) ([place])   . MI (myocardial infarction) ([place])   . Sleep apnea     Medications:  Prescriptions prior to admission  Medication Sig Dispense Refill Last Dose  . aspirin EC 81 MG tablet Take 81 mg by mouth at bedtime.   05/04/2015 at Unknown time  . carvedilol (COREG) 6.25 MG tablet Take 6.25 mg by mouth 2 (two) times daily.   05/04/2015 at 0730  . Cyanocobalamin (RA VITAMIN B-12 TR) 1000 MCG TBCR Take 1,000 mcg by mouth every morning.    05/04/2015 at Unknown time  . ferrous sulfate 325 (65 FE) MG tablet Take 325 mg by mouth every morning.    05/04/2015 at Unknown time  . fluticasone (FLONASE) 50 MCG/ACT nasal spray Place 2 sprays into the nose daily as needed. For allergies.   Past Month at Unknown time  . lisinopril (PRINIVIL,ZESTRIL) 5 MG tablet Take 5 mg by mouth every morning.   05/04/2015 at Unknown time  . lovastatin (MEVACOR) 20 MG tablet Take 20 mg by mouth at bedtime.    05/03/2015 at Unknown time  . lovastatin (MEVACOR) 40 MG tablet Take 40 mg by mouth at bedtime.   haven't started yet  . Multiple Vitamins-Minerals (CENTRUM SILVER ULTRA MENS) TABS Take 1 tablet by mouth every morning.   05/04/2015 at Unknown time  . Omega 3 1000 MG CAPS Take 1,000 mg by mouth every morning.    05/04/2015 at Unknown time  . predniSONE (DELTASONE) 10 MG tablet Take 10-40 mg by mouth See admin instructions. Take 4 tabs (40mg ) by mouth daily x 3 days, then 3 tabs (30mg ) by mouth daily x 3 days, then 2 tabs (20mg ) by mouth daily x 3 days, then 1 tabs by mouth daily x 3 days, then stop.   05/04/2015 at Unknown time  . vitamin E 400 UNIT capsule Take 400 Units by mouth every morning.    05/04/2015 at Unknown time    Assessment: Pharmacy consulted to dose heparin in this 65 year old male admitted with ACS.   Goal of Therapy:  Heparin level 0.3-0.7 units/ml Monitor platelets  by anticoagulation protocol: Yes   Plan:  Heparin level initially high but likely drawn downstream from infusion. Repeat heparin level therapeutic after redraw. Will f/u AM labs.    Ulice Dash D, Pharm.D., BCPS Clinical Pharmacist 05/05/2015,1:00 PM

## 2015-05-06 ENCOUNTER — Encounter
Admission: EM | Disposition: A | Discharge status: Other Acute Inpatient Hospital | Payer: Self-pay | Source: Home / Self Care | Attending: Internal Medicine

## 2015-05-06 HISTORY — PX: CARDIAC CATHETERIZATION: SHX172

## 2015-05-06 LAB — CBC
HCT: 38.2 % — ABNORMAL LOW (ref 40.0–52.0)
HEMOGLOBIN: 12.8 g/dL — AB (ref 13.0–18.0)
MCH: 31.2 pg (ref 26.0–34.0)
MCHC: 33.6 g/dL (ref 32.0–36.0)
MCV: 92.8 fL (ref 80.0–100.0)
PLATELETS: 190 10*3/uL (ref 150–440)
RBC: 4.12 MIL/uL — AB (ref 4.40–5.90)
RDW: 13.7 % (ref 11.5–14.5)
WBC: 9.4 10*3/uL (ref 3.8–10.6)

## 2015-05-06 LAB — HEPARIN LEVEL (UNFRACTIONATED): HEPARIN UNFRACTIONATED: 0.32 [IU]/mL (ref 0.30–0.70)

## 2015-05-06 SURGERY — LEFT HEART CATH
Anesthesia: Moderate Sedation

## 2015-05-06 MED ORDER — ONDANSETRON HCL 4 MG/2ML IJ SOLN: 4.0000 mg | Freq: Four times a day (QID) | INTRAMUSCULAR | Status: DC | PRN

## 2015-05-06 MED ORDER — LISINOPRIL 5 MG PO TABS
5.0000 mg | ORAL_TABLET | Freq: Every day | ORAL | Status: DC
  Administered 2015-05-06 – 2015-05-07 (×2): 5 mg via ORAL
  Filled 2015-05-06 (×2): qty 1

## 2015-05-06 MED ORDER — SODIUM CHLORIDE 0.9 % IV SOLN: 250.0000 mL | INTRAVENOUS | Status: DC | PRN

## 2015-05-06 MED ORDER — ACETAMINOPHEN 325 MG PO TABS: 650.0000 mg | ORAL_TABLET | ORAL | Status: DC | PRN

## 2015-05-06 MED ORDER — SODIUM CHLORIDE 0.9 % IJ SOLN: 3.0000 mL | INTRAMUSCULAR | Status: DC | PRN

## 2015-05-06 MED ORDER — SODIUM CHLORIDE 0.9 % WEIGHT BASED INFUSION
3.0000 mL/kg/h | INTRAVENOUS | Status: AC
  Administered 2015-05-06: 3 mL/kg/h via INTRAVENOUS

## 2015-05-06 MED ORDER — ATORVASTATIN CALCIUM 20 MG PO TABS
40.0000 mg | ORAL_TABLET | Freq: Every day | ORAL | Status: DC
  Administered 2015-05-06 – 2015-05-07 (×2): 40 mg via ORAL
  Filled 2015-05-06 (×2): qty 2

## 2015-05-06 MED ORDER — SODIUM CHLORIDE 0.9 % WEIGHT BASED INFUSION: 1.0000 mL/kg/h | INTRAVENOUS | Status: DC

## 2015-05-06 MED ORDER — AMIODARONE HCL 200 MG PO TABS
400.0000 mg | ORAL_TABLET | Freq: Two times a day (BID) | ORAL | Status: DC
  Administered 2015-05-06 – 2015-05-07 (×3): 400 mg via ORAL
  Filled 2015-05-06 (×4): qty 2

## 2015-05-06 MED ORDER — SODIUM CHLORIDE 0.9 % IV SOLN
INTRAVENOUS | Status: DC
  Administered 2015-05-06: 13:00:00 via INTRAVENOUS

## 2015-05-06 MED ORDER — ASPIRIN 81 MG PO CHEW: 81.0000 mg | CHEWABLE_TABLET | ORAL | Status: DC

## 2015-05-06 MED ORDER — MIDAZOLAM HCL 2 MG/2ML IJ SOLN
INTRAMUSCULAR | Status: AC
  Filled 2015-05-06: qty 2

## 2015-05-06 MED ORDER — MIDAZOLAM HCL 2 MG/2ML IJ SOLN
INTRAMUSCULAR | Status: DC | PRN
  Administered 2015-05-06: 1 mg via INTRAVENOUS

## 2015-05-06 MED ORDER — HEPARIN (PORCINE) IN NACL 2-0.9 UNIT/ML-% IJ SOLN
INTRAMUSCULAR | Status: AC
  Filled 2015-05-06: qty 1000

## 2015-05-06 MED ORDER — FENTANYL CITRATE (PF) 100 MCG/2ML IJ SOLN
INTRAMUSCULAR | Status: DC | PRN
  Administered 2015-05-06: 50 ug via INTRAVENOUS

## 2015-05-06 MED ORDER — SODIUM CHLORIDE 0.9 % IJ SOLN: 3.0000 mL | Freq: Two times a day (BID) | INTRAMUSCULAR | Status: DC

## 2015-05-06 MED ORDER — IOHEXOL 300 MG/ML  SOLN
INTRAMUSCULAR | Status: DC | PRN
  Administered 2015-05-06: 110 mL via INTRA_ARTICULAR

## 2015-05-06 MED ORDER — SODIUM CHLORIDE 0.9% FLUSH
3.0000 mL | Freq: Two times a day (BID) | INTRAVENOUS | Status: DC
  Administered 2015-05-06 – 2015-05-07 (×3): 3 mL via INTRAVENOUS

## 2015-05-06 MED ORDER — SODIUM CHLORIDE 0.9 % WEIGHT BASED INFUSION: 3.0000 mL/kg/h | INTRAVENOUS | Status: DC

## 2015-05-06 MED ORDER — SODIUM CHLORIDE 0.9% FLUSH: 3.0000 mL | INTRAVENOUS | Status: DC | PRN

## 2015-05-06 MED ORDER — FENTANYL CITRATE (PF) 100 MCG/2ML IJ SOLN
INTRAMUSCULAR | Status: AC
  Filled 2015-05-06: qty 2

## 2015-05-06 SURGICAL SUPPLY — 10 items
CATH INFINITI 5FR ANG PIGTAIL (CATHETERS) ×3 IMPLANT
CATH INFINITI 5FR JL4 (CATHETERS) ×3 IMPLANT
CATH INFINITI 5FR JL5 (CATHETERS) ×3 IMPLANT
CATH INFINITI JR4 5F (CATHETERS) ×3 IMPLANT
DEVICE CLOSURE MYNXGRIP 5F (Vascular Products) ×3 IMPLANT
KIT MANI 3VAL PERCEP (MISCELLANEOUS) ×3 IMPLANT
NEEDLE PERC 18GX7CM (NEEDLE) ×3 IMPLANT
PACK CARDIAC CATH (CUSTOM PROCEDURE TRAY) ×3 IMPLANT
SHEATH PINNACLE 5F 10CM (SHEATH) ×3 IMPLANT
WIRE EMERALD 3MM-J .035X150CM (WIRE) ×3 IMPLANT

## 2015-05-06 NOTE — Progress Notes (Signed)
[facility] Encounter Note  Patient: Jay [name] / Admit Date: 05/04/2015 / Date of Encounter: 05/06/2015, 5:56 PM   Subjective: Shortness of breath but no evidence of chest discomfort or episodes of tachycardia  Review of Systems: Positive for: [place] of breath Negative for: Vision change, hearing change, syncope, dizziness, nausea, vomiting,diarrhea, bloody stool, stomach pain, cough, congestion, diaphoresis, urinary frequency, urinary pain,skin lesions, skin rashes Others previously listed  Objective: Telemetry: Normal sinus rhythm Physical Exam: Blood pressure 158/86, pulse 51, temperature 97.8 F (36.6 C), temperature source Oral, resp. rate 15, height 6\' 1"  (1.854 m), weight 208 lb 14.4 oz (94.756 kg), SpO2 95 %. Body mass index is 27.57 kg/(m^2). General: Well developed, well nourished, in no acute distress. Head: Normocephalic, atraumatic, sclera non-icteric, no xanthomas, nares are without discharge. Neck: No apparent masses Lungs: Normal respirations with no wheezes, no rhonchi, no rales , no crackles   Heart: Regular rate and rhythm, normal S1 S2, no murmur, no rub, no gallop, PMI is normal size and placement, carotid upstroke normal without bruit, jugular venous pressure normal Abdomen: Soft, non-tender, non-distended with normoactive bowel sounds. No hepatosplenomegaly. Abdominal aorta is normal size without bruit Extremities: No edema, no clubbing, no cyanosis, no ulcers,  Peripheral: 2+ radial, 2+ femoral, 2+ dorsal pedal pulses Neuro: Alert and oriented. Moves all extremities spontaneously. Psych:  Responds to questions appropriately with a normal affect.   Intake/Output Summary (Last 24 hours) at 05/06/15 1756 Last data filed at 05/06/15 1100  Gross per 24 hour  Intake  478.2 ml  Output   1525 ml  Net -1046.8 ml    Inpatient Medications:  . amiodarone  400 mg Oral BID  . aspirin EC  81 mg Oral QHS  . atorvastatin  40 mg Oral q1800   . lisinopril  5 mg Oral Daily  . sodium chloride  3 mL Intravenous Q12H  . sodium chloride flush  3 mL Intravenous Q12H   Infusions:  . sodium chloride 3 mL/kg/hr (05/06/15 1530)  . heparin Stopped (05/06/15 1543)    Labs:  Recent Labs  05/04/15 1901 05/05/15 0431  NA 136 139  K 4.1 4.1  CL 106 108  CO2 22 26  GLUCOSE 175* 120*  BUN 21* 18  CREATININE 1.18 0.95  CALCIUM 9.2 8.6*    Recent Labs  05/04/15 1901  AST 26  ALT 24  ALKPHOS 52  BILITOT 1.3*  PROT 7.5  ALBUMIN 4.5    Recent Labs  05/04/15 1901 05/05/15 0431 05/06/15 0440  WBC 14.3* 11.9* 9.4  NEUTROABS 11.3*  --   --   HGB 14.0 12.4* 12.8*  HCT 41.6 36.4* 38.2*  MCV 92.3 92.3 92.8  PLT 262 188 190    Recent Labs  05/04/15 1901 05/04/15 2250 05/05/15 0431 05/05/15 1043  TROPONINI 0.03 0.06* 0.07* 0.07*   Invalid input(s): POCBNP No results for input(s): HGBA1C in the last 72 hours.   Weights: Filed Weights   05/04/15 1852 05/04/15 2200 05/06/15 1511  Weight: 211 lb (95.709 kg) 212 lb 4.9 oz (96.3 kg) 208 lb 14.4 oz (94.756 kg)     Radiology/Studies:  Dg Chest 1 View  05/04/2015  CLINICAL DATA:  Chest pain EXAM: CHEST 1 VIEW COMPARISON:  None. FINDINGS: Hyperinflation. Borderline cardiomegaly. Negative aortic and hilar contours. There is no edema, consolidation, effusion, or pneumothorax. IMPRESSION: 1. No acute finding. 2. Hyperinflation. Electronically Signed   By: Monte Fantasia M.D.   On: 05/04/2015 20:04  Assessment and Recommendation  65 y.o. male with known moderate dilated cardiomyopathy with ejection fraction of 35% and global hypokinesis by echocardiogram with insignificant three-vessel coronary artery disease having sleep apnea and minimal elevation of troponin consistent with demand ischemia from incessant ventricular tachycardia now treated with amiodarone and resolved 1. Change amiodarone to 400 mg twice per day and began ambulating following for improvements and no  evidence of further evidence of the tachycardia 2. No further cardiac workup at this time 3. Further outpatient evaluation including the possibility of defibrillator placement as well as the possibility of ablation of ventricular tachycardia if able 4. No use of beta blocker at this time due to concerns of bradycardia with amiodarone use 5. Continue ACE inhibitor for cardiomyopathy  Signed, Serafina Royals M.D. FACC

## 2015-05-06 NOTE — Progress Notes (Signed)
[name] NAME: Jay [name]    MR#:  [id number]  DATE OF BIRTH:  [birth year]  SUBJECTIVE:  CHIEF COMPLAINT:   Chief Complaint  Patient presents with  . Chest Pain  cath showed insignificant CAD. No further chest pain, patient reports plan for defib while here (may get transferred to [facility]) later tonight or tomorrow  REVIEW OF SYSTEMS:  Review of Systems  Constitutional: Negative for fever, weight loss, malaise/fatigue and diaphoresis.  HENT: Negative for ear discharge, ear pain, hearing loss, nosebleeds, sore throat and tinnitus.   Eyes: Negative for blurred vision and pain.  Respiratory: Negative for cough, hemoptysis, shortness of breath and wheezing.   Cardiovascular: Negative for chest pain, palpitations, orthopnea and leg swelling.  Gastrointestinal: Negative for heartburn, nausea, vomiting, abdominal pain, diarrhea, constipation and blood in stool.  Genitourinary: Negative for dysuria, urgency and frequency.  Musculoskeletal: Negative for myalgias and back pain.  Skin: Negative for itching and rash.  Neurological: Negative for dizziness, tingling, tremors, focal weakness, seizures, weakness and headaches.  Psychiatric/Behavioral: Negative for depression. The patient is not nervous/anxious.    DRUG ALLERGIES:  No Known Allergies VITALS:  Blood pressure 158/86, pulse 51, temperature 97.8 F (36.6 C), temperature source Oral, resp. rate 15, height 6\' 1"  (1.854 m), weight 94.756 kg (208 lb 14.4 oz), SpO2 95 %. PHYSICAL EXAMINATION:  Physical Exam  Constitutional: He is oriented to person, place, and time and well-developed, well-nourished, and in no distress.  HENT:  Head: Normocephalic and atraumatic.  Eyes: Conjunctivae and EOM are normal. Pupils are equal, round, and reactive to light.  Neck: Normal range of motion. Neck supple. No tracheal deviation present. No thyromegaly present.  Cardiovascular: Normal  rate, regular rhythm and normal heart sounds.   Pulmonary/Chest: Effort normal and breath sounds normal. No respiratory distress. He has no wheezes. He exhibits no tenderness.  Abdominal: Soft. Bowel sounds are normal. He exhibits no distension. There is no tenderness.  Musculoskeletal: Normal range of motion.  Neurological: He is alert and oriented to person, place, and time. No cranial nerve deficit.  Skin: Skin is warm and dry. No rash noted.  Psychiatric: Mood and affect normal.   LABORATORY PANEL:   CBC  Recent Labs Lab 05/06/15 0440  WBC 9.4  HGB 12.8*  HCT 38.2*  PLT 190   ------------------------------------------------------------------------------------------------------------------ Chemistries   Recent Labs Lab 05/04/15 1901 05/05/15 0431  NA 136 139  K 4.1 4.1  CL 106 108  CO2 22 26  GLUCOSE 175* 120*  BUN 21* 18  CREATININE 1.18 0.95  CALCIUM 9.2 8.6*  AST 26  --   ALT 24  --   ALKPHOS 52  --   BILITOT 1.3*  --    RADIOLOGY:  No results found. ASSESSMENT AND PLAN:   * V-tach ([name]) - currently in sinus rhythm with a controlled rate. Cath showed insignificant CAD. Changed amiodarone to 400 mg PO BID. possibility of defibrillator placement as well as the possibility of ablation of ventricular tachycardia if able - No use of beta blocker at this time due to concerns of bradycardia with amiodarone use - Continue ACE inhibitor for cardiomyopathy  * CAD (coronary artery disease) - cath in am, appreciate cardio input. Continue statin, asa.  * Nonischemic dilated cardiomyopathy ([name]) - continue home medications.  * HTN (hypertension) - somewhat high. On amiodarone and lisinopril.  * Chronic systolic CHF (congestive heart failure) ([name]) - continue home meds for this,  does not seem to be in acute exacerbation.     All the records are reviewed and case discussed with Care Management/Social Worker. Management plans discussed with the patient, family and  they are in agreement.  CODE STATUS: FULL CODE  TOTAL TIME TAKING CARE OF THIS PATIENT: 35 minutes.   More than 50% of the time was spent in counseling/coordination of care: YES (d/w wife and Dr Nehemiah Massed).  POSSIBLE D/C IN am, DEPENDING ON CLINICAL CONDITION and cardio plans - AICD OR PACER implant? (transfer to [facility]?)   [facility], [name] M.D on 05/06/2015 at 6:01 PM  Between 7am to 6pm - Pager - [phone]  After 6pm go to [email or web address] - password [email or web address] [facility]  [facility]  Office  [phone]  CC: Primary care physician; No primary care provider on file.  Note: This dictation was prepared with Dragon dictation along with smaller phrase technology. Any transcriptional errors that result from this process are unintentional.

## 2015-05-06 NOTE — Progress Notes (Signed)
ANTICOAGULATION CONSULT NOTE - Initial Consult  Pharmacy Consult for Heparin  Indication: chest pain/ACS  No Known Allergies  Patient Measurements: Height: 6\' 1"  (185.4 cm) Weight: 212 lb 4.9 oz (96.3 kg) IBW/kg (Calculated) : 79.9 Heparin Dosing Weight:  95.7 kg   Vital Signs: Temp: 97.7 F (36.5 C) (01/24 0400) Temp Source: Oral (01/24 0400) BP: 108/69 mmHg (01/24 0400) Pulse Rate: 42 (01/24 0400)  Labs:  Recent Labs  05/04/15 1901 05/04/15 2250  05/05/15 0431 05/05/15 0815 05/05/15 1043 05/05/15 1132 05/06/15 0440  HGB 14.0  --   --  12.4*  --   --   --  12.8*  HCT 41.6  --   --  36.4*  --   --   --  38.2*  PLT 262  --   --  188  --   --   --  190  HEPARINUNFRC  --   --   < >  --  1.45*  --  0.43 0.32  CREATININE 1.18  --   --  0.95  --   --   --   --   TROPONINI 0.03 0.06*  --  0.07*  --  0.07*  --   --   < > = values in this interval not displayed.  Estimated Creatinine Clearance: 94.8 mL/min (by C-G formula based on Cr of 0.95).   Medical History: Past Medical History  Diagnosis Date  . Hypertension   . Coronary artery disease   . CHF (congestive heart failure) ([place])   . MI (myocardial infarction) ([place])   . Sleep apnea     Medications:  Prescriptions prior to admission  Medication Sig Dispense Refill Last Dose  . aspirin EC 81 MG tablet Take 81 mg by mouth at bedtime.   05/04/2015 at Unknown time  . carvedilol (COREG) 6.25 MG tablet Take 6.25 mg by mouth 2 (two) times daily.   05/04/2015 at 0730  . Cyanocobalamin (RA VITAMIN B-12 TR) 1000 MCG TBCR Take 1,000 mcg by mouth every morning.    05/04/2015 at Unknown time  . ferrous sulfate 325 (65 FE) MG tablet Take 325 mg by mouth every morning.    05/04/2015 at Unknown time  . fluticasone (FLONASE) 50 MCG/ACT nasal spray Place 2 sprays into the nose daily as needed. For allergies.   Past Month at Unknown time  . lisinopril (PRINIVIL,ZESTRIL) 5 MG tablet Take 5 mg by mouth every morning.   05/04/2015 at Unknown  time  . lovastatin (MEVACOR) 20 MG tablet Take 20 mg by mouth at bedtime.   05/03/2015 at Unknown time  . lovastatin (MEVACOR) 40 MG tablet Take 40 mg by mouth at bedtime.   haven't started yet  . Multiple Vitamins-Minerals (CENTRUM SILVER ULTRA MENS) TABS Take 1 tablet by mouth every morning.   05/04/2015 at Unknown time  . Omega 3 1000 MG CAPS Take 1,000 mg by mouth every morning.    05/04/2015 at Unknown time  . predniSONE (DELTASONE) 10 MG tablet Take 10-40 mg by mouth See admin instructions. Take 4 tabs (40mg ) by mouth daily x 3 days, then 3 tabs (30mg ) by mouth daily x 3 days, then 2 tabs (20mg ) by mouth daily x 3 days, then 1 tabs by mouth daily x 3 days, then stop.   05/04/2015 at Unknown time  . vitamin E 400 UNIT capsule Take 400 Units by mouth every morning.    05/04/2015 at Unknown time    Assessment: Pharmacy consulted to dose heparin  in this 65 year old male admitted with ACS.   Goal of Therapy:  Heparin level 0.3-0.7 units/ml Monitor platelets by anticoagulation protocol: Yes   Plan:  Heparin level initially high but likely drawn downstream from infusion. Repeat heparin level therapeutic after redraw. Will f/u AM labs.   1/24 AM heparin level 0.32. Continue current regimen and recheck tomorrow AM.   Sakshi Sermons S, Pharm.D., BCPS Clinical Pharmacist 05/06/2015,6:45 AM

## 2015-05-07 ENCOUNTER — Encounter: Payer: Self-pay | Admitting: Cardiology

## 2015-05-07 LAB — CBC
HCT: 40 % (ref 40.0–52.0)
HEMOGLOBIN: 13.5 g/dL (ref 13.0–18.0)
MCH: 31 pg (ref 26.0–34.0)
MCHC: 33.8 g/dL (ref 32.0–36.0)
MCV: 91.7 fL (ref 80.0–100.0)
PLATELETS: 194 10*3/uL (ref 150–440)
RBC: 4.36 MIL/uL — AB (ref 4.40–5.90)
RDW: 13.5 % (ref 11.5–14.5)
WBC: 8.5 10*3/uL (ref 3.8–10.6)

## 2015-05-07 LAB — HEPARIN LEVEL (UNFRACTIONATED): Heparin Unfractionated: <0.1 IU/mL — ABNORMAL LOW (ref 0.30–0.70)

## 2015-05-07 NOTE — Progress Notes (Signed)
Patient has no acute event overnight. He remained hemodynamically stable, with asymptomatic brady in the 19s. Patient VS remained WDl for patient.Patient cath site remained clean dry and intact w/o drainage or hematoma.Patient denied chest pain or discomfort. Patient's wife was at the bedside overnight.  Dr. Estanislado Pandy and pharmacy was contacted about the patient's heparin infusion that was stopped during day shift on 05/06/2015. Care nurse was advised to check with cardiology in the morning  if they want the heparin infusion continued.

## 2015-05-07 NOTE — Progress Notes (Signed)
Room air. [name]. Urinal. Ambulating in the room. Wife at the bedside.  No pain.Takes meds ok. A & O. Cath site clear of bleeding and hematoma. Waiting tx to [facility].

## 2015-05-07 NOTE — Care Management Important Message (Signed)
Important Message  Patient Details  Name: Jay [name] MRN: [id number] Date of Birth: 1950-05-03   Medicare Important Message Given:  Yes    Juliann Pulse A Talia Hoheisel 05/07/2015, 10:31 AM

## 2015-05-07 NOTE — Discharge Summary (Addendum)
Jay [name], is a 65 y.o. male  DOB November 02, 1950  MRN [id number].  Admission date:  05/04/2015  Admitting Physician  Lance Coon, MD  Discharge Date:  05/07/2015   Primary MD  No primary care provider on file.  Family doctor is Dr. Netty Starring Follow up with PCP in 1 weeks after discharge from [facility]  Recommendations for primary care physician for things to follow:   Patient  is going to [facility] for ICD placement.    Admission Diagnosis  Ventricular tachycardia (HCC) [I47.2] Chest pain, unspecified chest pain type [R07.9]   Discharge Diagnosis  Ventricular tachycardia ([place]) [I47.2] Chest pain, unspecified chest pain type [R07.9]    Principal Problem:   V-tach [facility]) Active Problems:   CAD (coronary artery disease)   HTN (hypertension)   Chronic systolic CHF (congestive heart failure) ([place])   Nonischemic dilated cardiomyopathy ([place])      Past Medical History  Diagnosis Date  . Hypertension   . Coronary artery disease   . CHF (congestive heart failure) ([place])   . MI (myocardial infarction) ([place])   . Sleep apnea     Past Surgical History  Procedure Laterality Date  . Cataract extraction    . Retinal detachment surgery         History of present illness and  Hospital Course:     Kindly see H&P for history of present illness and admission details, please review complete Labs, Consult reports and Test reports for all details in brief  HPI  from the history and physical done on the day of admission  65 year old male patient with history of chronic systolic heart failure, sleep apnea and chest discomfort, palpitations found to have V. tach with heart rate 1 60 bpm. Patient was cardioverted in the emergency room and started on amiodarone drip. And he is admitted to ICU.  Hospital  Course  #1 ventricular tachycardia status post defibrillation, amiodarone drip. Patient seen by cardiology Dr. Nehemiah Massed, amiodarone drip was Tapered off and changed to amiodarone 400 mg by mouth twice a day. Echocardiogram is done, showed EF 35-40%. Patient was taken to cardiac catheterization, patient cardiac catheterization showed nonsignificant coronary artery disease. Has severe nonischemic cardiopathy myopathy, because of monomorphic sustained V. tach with episodes of bradycardia, patient needs ICD placement. He will go to [facility] today for ICD placement. #2. Hypertension: Continue amiodarone, lisinopril. 3 nonischemic cardiomyopathy: Continue aspirin, lisinopril. Coreg is stopped because of bradycardia episodes. Amiodarone dose also needs to be adjusted further because of bradycardia /  Discharge Condition: stable   Follow UP      Discharge Instructions  and  Discharge Medications   And we will go to [facility] today.     Medication List    ASK your doctor about these medications        aspirin EC 81 MG tablet  Take 81 mg by mouth at bedtime.     carvedilol 6.25 MG tablet  Commonly known as:  COREG  Take 6.25 mg by mouth 2 (two) times daily.     CENTRUM SILVER ULTRA MENS Tabs  Take 1 tablet by mouth every morning.     ferrous sulfate 325 (65 FE) MG tablet  Take 325 mg by mouth every morning.     fluticasone 50 MCG/ACT nasal spray  Commonly known as:  FLONASE  Place 2 sprays into the nose daily as needed. For allergies.     lisinopril 5 MG tablet  Commonly known as:  PRINIVIL,ZESTRIL  Take 5 mg by mouth every morning.     lovastatin 20 MG tablet  Commonly known as:  MEVACOR  Take 20 mg by mouth at bedtime.     lovastatin 40 MG tablet  Commonly known as:  MEVACOR  Take 40 mg by mouth at bedtime.     Omega 3 1000 MG Caps  Take 1,000 mg by mouth every morning.     predniSONE 10 MG tablet  Commonly known as:   DELTASONE  Take 10-40 mg by mouth See admin instructions. Take 4 tabs (40mg ) by mouth daily x 3 days, then 3 tabs (30mg ) by mouth daily x 3 days, then 2 tabs (20mg ) by mouth daily x 3 days, then 1 tabs by mouth daily x 3 days, then stop.     RA VITAMIN B-12 TR 1000 MCG Tbcr  Generic drug:  Cyanocobalamin  Take 1,000 mcg by mouth every morning.     vitamin E 400 UNIT capsule  Take 400 Units by mouth every morning.          Diet and Activity recommendation: See Discharge Instructions above   Consults obtained - cardiology   Major procedures and Radiology Reports - PLEASE review detailed and final reports for all details, in brief -     Dg Chest 1 View  05/04/2015  CLINICAL DATA:  Chest pain EXAM: CHEST 1 VIEW COMPARISON:  None. FINDINGS: Hyperinflation. Borderline cardiomegaly. Negative aortic and hilar contours. There is no edema, consolidation, effusion, or pneumothorax. IMPRESSION: 1. No acute finding. 2. Hyperinflation. Electronically Signed   By: Monte Fantasia M.D.   On: 05/04/2015 20:04    Micro Results    Recent Results (from the past 240 hour(s))  MRSA PCR Screening     Status: None   Collection Time: 05/04/15 10:00 PM  Result Value Ref Range Status   MRSA by PCR NEGATIVE NEGATIVE Final    Comment:        The GeneXpert MRSA Assay (FDA approved for NASAL specimens only), is one component of a comprehensive MRSA colonization surveillance program. It is not intended to diagnose MRSA infection nor to guide or monitor treatment for MRSA infections.        Today   Subjective:   Juluis Pitch today stable for transfer to [facility]   Objective:   Blood pressure 127/78, pulse 49, temperature 98.3 F (36.8 C), temperature source Oral, resp. rate 16, height 6\' 1"  (1.854 m), weight 92.806 kg (204 lb 9.6 oz), SpO2 96 %.   Intake/Output Summary (Last 24 hours) at 05/07/15 0816 Last data filed at 05/07/15 0304  Gross per 24 hour   Intake  322.2 ml  Output    525 ml  Net -202.8 ml    Exam Awake Alert, Oriented x 3, No new F.N deficits, Normal affect [place].AT,PERRAL Supple Neck,No JVD, No cervical lymphadenopathy appriciated.  Symmetrical Chest wall movement, Good air movement bilaterally, CTAB RRR,No Gallops,Rubs or new Murmurs, No Parasternal Heave +ve B.Sounds, Abd Soft, Non tender, No organomegaly appriciated, No rebound -guarding or rigidity. No Cyanosis, Clubbing or edema, No new Rash or bruise  Data Review   CBC w Diff: Lab Results  Component Value Date   WBC 8.5 05/07/2015   HGB 13.5 05/07/2015   HCT 40.0 05/07/2015   PLT 194 05/07/2015   LYMPHOPCT 17 05/04/2015   MONOPCT 3 05/04/2015   EOSPCT 1 05/04/2015   BASOPCT 0 05/04/2015    CMP: Lab  Results  Component Value Date   NA 139 05/05/2015   K 4.1 05/05/2015   CL 108 05/05/2015   CO2 26 05/05/2015   BUN 18 05/05/2015   CREATININE 0.95 05/05/2015   PROT 7.5 05/04/2015   ALBUMIN 4.5 05/04/2015   BILITOT 1.3* 05/04/2015   ALKPHOS 52 05/04/2015   AST 26 05/04/2015   ALT 24 05/04/2015  . Accepting physician name is Dr. Mylinda Latina. Total Time in preparing paper work, data evaluation and todays exam - 7 minutes  Jalyne Brodzinski M.D on 05/07/2015 at 8:16 AM    Note: This dictation was prepared with Dragon dictation along with smaller phrase technology. Any transcriptional errors that result from this process are unintentional.

## 2015-05-07 NOTE — Progress Notes (Signed)
Report called to Janace Hoard, Therapist, sports at [facility] will be used for tx. Family made aware.

## 2015-05-07 NOTE — Progress Notes (Signed)
[facility] Encounter Note  Patient: Jay [name] / Admit Date: 05/04/2015 / Date of Encounter: 05/07/2015, 5:33 PM   Subjective: Shortness of breath but no evidence of chest discomfort or episodes of tachycardia. Patient recovered from cardiac catheterization without complication  Review of Systems: Positive for: None Negative for: Vision change, hearing change, syncope, dizziness, nausea, vomiting,diarrhea, bloody stool, stomach pain, cough, congestion, diaphoresis, urinary frequency, urinary pain,skin lesions, skin rashes Others previously listed  Objective: Telemetry: Normal sinus rhythm Physical Exam: Blood pressure 96/51, pulse 57, temperature 98 F (36.7 C), temperature source Oral, resp. rate 16, height 6\' 1"  (1.854 m), weight 204 lb 9.6 oz (92.806 kg), SpO2 98 %. Body mass index is 27 kg/(m^2). General: Well developed, well nourished, in no acute distress. Head: Normocephalic, atraumatic, sclera non-icteric, no xanthomas, nares are without discharge. Neck: No apparent masses Lungs: Normal respirations with no wheezes, no rhonchi, no rales , no crackles   Heart: Regular rate and rhythm, normal S1 S2, no murmur, no rub, no gallop, PMI is normal size and placement, carotid upstroke normal without bruit, jugular venous pressure normal Abdomen: Soft, non-tender, non-distended with normoactive bowel sounds. No hepatosplenomegaly. Abdominal aorta is normal size without bruit Extremities: No edema, no clubbing, no cyanosis, no ulcers,  Peripheral: 2+ radial, 2+ femoral, 2+ dorsal pedal pulses Neuro: Alert and oriented. Moves all extremities spontaneously. Psych:  Responds to questions appropriately with a normal affect.   Intake/Output Summary (Last 24 hours) at 05/07/15 1733 Last data filed at 05/07/15 1130  Gross per 24 hour  Intake    600 ml  Output      0 ml  Net    600 ml    Inpatient Medications:  . amiodarone  400 mg Oral BID  . aspirin EC  81  mg Oral QHS  . atorvastatin  40 mg Oral q1800  . lisinopril  5 mg Oral Daily  . sodium chloride  3 mL Intravenous Q12H  . sodium chloride flush  3 mL Intravenous Q12H   Infusions:     Labs:  Recent Labs  05/04/15 1901 05/05/15 0431  NA 136 139  K 4.1 4.1  CL 106 108  CO2 22 26  GLUCOSE 175* 120*  BUN 21* 18  CREATININE 1.18 0.95  CALCIUM 9.2 8.6*    Recent Labs  05/04/15 1901  AST 26  ALT 24  ALKPHOS 52  BILITOT 1.3*  PROT 7.5  ALBUMIN 4.5    Recent Labs  05/04/15 1901  05/06/15 0440 05/07/15 0458  WBC 14.3*  < > 9.4 8.5  NEUTROABS 11.3*  --   --   --   HGB 14.0  < > 12.8* 13.5  HCT 41.6  < > 38.2* 40.0  MCV 92.3  < > 92.8 91.7  PLT 262  < > 190 194  < > = values in this interval not displayed.  Recent Labs  05/04/15 1901 05/04/15 2250 05/05/15 0431 05/05/15 1043  TROPONINI 0.03 0.06* 0.07* 0.07*   Invalid input(s): POCBNP No results for input(s): HGBA1C in the last 72 hours.   Weights: Filed Weights   05/04/15 2200 05/06/15 1511 05/07/15 0448  Weight: 212 lb 4.9 oz (96.3 kg) 208 lb 14.4 oz (94.756 kg) 204 lb 9.6 oz (92.806 kg)     Radiology/Studies:  Dg Chest 1 View  05/04/2015  CLINICAL DATA:  Chest pain EXAM: CHEST 1 VIEW COMPARISON:  None. FINDINGS: Hyperinflation. Borderline cardiomegaly. Negative aortic and hilar contours. There is no  edema, consolidation, effusion, or pneumothorax. IMPRESSION: 1. No acute finding. 2. Hyperinflation. Electronically Signed   By: Monte Fantasia M.D.   On: 05/04/2015 20:04     Assessment and Recommendation  65 y.o. male with known moderate dilated cardiomyopathy with ejection fraction of 35% and global hypokinesis by echocardiogram with insignificant three-vessel coronary artery disease having sleep apnea and minimal elevation of troponin consistent with demand ischemia from incessant ventricular tachycardia now treated with amiodarone and resolved 1. Continue amiodarone to 400 mg twice per day and began  ambulating following for improvements and no evidence of further evidence of the tachycardia 2. No further cardiac workup at this time 3. Transfer to [facility] for defibrillator placement due to ventricular tachycardia with rapid ventricular rate due to cardiomyopathy and LV systolic dysfunction 4. No use of beta blocker at this time due to concerns of bradycardia with amiodarone use 5. Continue ACE inhibitor for cardiomyopathy 6. Follow-up after above  Signed, Serafina Royals M.D. FACC

## 2015-05-07 NOTE — Care Management Note (Signed)
Case Management Note  Patient Details  Name: Jay [name] MRN: [id number] Date of Birth: 1950/08/01  Subjective/Objective:  Patient transferring to [facility]                  Action/Plan:   Expected Discharge Date:                  Expected Discharge Plan:  Acute to Acute Transfer  In-House Referral:     Discharge planning Services     Post Acute Care Choice:    Choice offered to:     DME Arranged:    DME Agency:     HH Arranged:    [place] Agency:     Status of Service:  Completed, signed off  Medicare Important Message Given:  Yes Date Medicare IM Given:    Medicare IM give by:    Date Additional Medicare IM Given:    Additional Medicare Important Message give by:     If discussed at [place] of Stay Meetings, dates discussed:    Additional Comments:  Jolly Mango, RN 05/07/2015, 1:10 PM

## 2015-05-08 DIAGNOSIS — I2510 Atherosclerotic heart disease of native coronary artery without angina pectoris: Secondary | ICD-10-CM | POA: Diagnosis not present

## 2015-05-08 DIAGNOSIS — E782 Mixed hyperlipidemia: Secondary | ICD-10-CM | POA: Diagnosis not present

## 2015-05-08 DIAGNOSIS — I10 Essential (primary) hypertension: Secondary | ICD-10-CM | POA: Diagnosis not present

## 2015-05-08 DIAGNOSIS — I428 Other cardiomyopathies: Secondary | ICD-10-CM | POA: Diagnosis not present

## 2015-05-08 DIAGNOSIS — Z85028 Personal history of other malignant neoplasm of stomach: Secondary | ICD-10-CM | POA: Diagnosis not present

## 2015-05-08 DIAGNOSIS — Z8679 Personal history of other diseases of the circulatory system: Secondary | ICD-10-CM | POA: Diagnosis not present

## 2015-05-08 DIAGNOSIS — G4733 Obstructive sleep apnea (adult) (pediatric): Secondary | ICD-10-CM | POA: Diagnosis not present

## 2015-05-08 DIAGNOSIS — I252 Old myocardial infarction: Secondary | ICD-10-CM | POA: Diagnosis not present

## 2015-05-08 DIAGNOSIS — E785 Hyperlipidemia, unspecified: Secondary | ICD-10-CM | POA: Diagnosis not present

## 2015-05-08 DIAGNOSIS — Z006 Encounter for examination for normal comparison and control in clinical research program: Secondary | ICD-10-CM | POA: Diagnosis not present

## 2015-05-08 DIAGNOSIS — I5022 Chronic systolic (congestive) heart failure: Secondary | ICD-10-CM | POA: Diagnosis not present

## 2015-05-08 DIAGNOSIS — I472 Ventricular tachycardia: Secondary | ICD-10-CM | POA: Diagnosis not present

## 2015-05-08 NOTE — Progress Notes (Signed)
[facility] was called about patient transfer and an ETA on 2300 on 05-07-2015 was given. Patient remained in asymptomatic SB and he was hemodynamically stable with VS WDL for patient. Patient wife was at his bedside.  Patient was transported of the unit by [facility] at 2321 on 05-07-2015.

## 2015-05-22 DIAGNOSIS — I472 Ventricular tachycardia: Secondary | ICD-10-CM | POA: Diagnosis not present

## 2015-05-26 DIAGNOSIS — Z8679 Personal history of other diseases of the circulatory system: Secondary | ICD-10-CM | POA: Diagnosis not present

## 2015-05-28 DIAGNOSIS — Z8679 Personal history of other diseases of the circulatory system: Secondary | ICD-10-CM | POA: Diagnosis not present

## 2015-06-09 DIAGNOSIS — E782 Mixed hyperlipidemia: Secondary | ICD-10-CM | POA: Diagnosis not present

## 2015-06-09 DIAGNOSIS — Z9889 Other specified postprocedural states: Secondary | ICD-10-CM | POA: Diagnosis not present

## 2015-06-09 DIAGNOSIS — I252 Old myocardial infarction: Secondary | ICD-10-CM | POA: Diagnosis not present

## 2015-06-09 DIAGNOSIS — I429 Cardiomyopathy, unspecified: Secondary | ICD-10-CM | POA: Diagnosis not present

## 2015-06-09 DIAGNOSIS — I519 Heart disease, unspecified: Secondary | ICD-10-CM | POA: Diagnosis not present

## 2015-06-09 DIAGNOSIS — Z95810 Presence of automatic (implantable) cardiac defibrillator: Secondary | ICD-10-CM | POA: Diagnosis not present

## 2015-06-09 DIAGNOSIS — I10 Essential (primary) hypertension: Secondary | ICD-10-CM | POA: Diagnosis not present

## 2015-06-09 DIAGNOSIS — I472 Ventricular tachycardia: Secondary | ICD-10-CM | POA: Diagnosis not present

## 2015-07-04 DIAGNOSIS — I472 Ventricular tachycardia: Secondary | ICD-10-CM | POA: Diagnosis not present

## 2015-07-04 DIAGNOSIS — I440 Atrioventricular block, first degree: Secondary | ICD-10-CM | POA: Diagnosis not present

## 2015-07-04 DIAGNOSIS — E785 Hyperlipidemia, unspecified: Secondary | ICD-10-CM | POA: Diagnosis not present

## 2015-07-04 DIAGNOSIS — R9431 Abnormal electrocardiogram [ECG] [EKG]: Secondary | ICD-10-CM | POA: Diagnosis not present

## 2015-07-04 DIAGNOSIS — I480 Paroxysmal atrial fibrillation: Secondary | ICD-10-CM | POA: Diagnosis not present

## 2015-07-04 DIAGNOSIS — Z95810 Presence of automatic (implantable) cardiac defibrillator: Secondary | ICD-10-CM | POA: Diagnosis not present

## 2015-07-04 DIAGNOSIS — I4581 Long QT syndrome: Secondary | ICD-10-CM | POA: Diagnosis not present

## 2015-07-04 DIAGNOSIS — I429 Cardiomyopathy, unspecified: Secondary | ICD-10-CM | POA: Diagnosis not present

## 2015-07-04 DIAGNOSIS — I493 Ventricular premature depolarization: Secondary | ICD-10-CM | POA: Diagnosis not present

## 2015-07-04 DIAGNOSIS — Z79899 Other long term (current) drug therapy: Secondary | ICD-10-CM | POA: Diagnosis not present

## 2015-07-04 DIAGNOSIS — Z4502 Encounter for adjustment and management of automatic implantable cardiac defibrillator: Secondary | ICD-10-CM | POA: Diagnosis not present

## 2015-07-14 DIAGNOSIS — I429 Cardiomyopathy, unspecified: Secondary | ICD-10-CM | POA: Diagnosis not present

## 2015-07-22 DIAGNOSIS — Z95810 Presence of automatic (implantable) cardiac defibrillator: Secondary | ICD-10-CM | POA: Diagnosis not present

## 2015-07-22 DIAGNOSIS — I110 Hypertensive heart disease with heart failure: Secondary | ICD-10-CM | POA: Diagnosis not present

## 2015-07-22 DIAGNOSIS — I493 Ventricular premature depolarization: Secondary | ICD-10-CM | POA: Diagnosis not present

## 2015-07-22 DIAGNOSIS — G4730 Sleep apnea, unspecified: Secondary | ICD-10-CM | POA: Diagnosis not present

## 2015-07-22 DIAGNOSIS — I472 Ventricular tachycardia: Secondary | ICD-10-CM | POA: Diagnosis not present

## 2015-07-22 DIAGNOSIS — E785 Hyperlipidemia, unspecified: Secondary | ICD-10-CM | POA: Diagnosis not present

## 2015-07-22 DIAGNOSIS — Z79899 Other long term (current) drug therapy: Secondary | ICD-10-CM | POA: Diagnosis not present

## 2015-07-22 DIAGNOSIS — I4891 Unspecified atrial fibrillation: Secondary | ICD-10-CM | POA: Diagnosis not present

## 2015-07-22 DIAGNOSIS — I429 Cardiomyopathy, unspecified: Secondary | ICD-10-CM | POA: Diagnosis not present

## 2015-08-01 DIAGNOSIS — I110 Hypertensive heart disease with heart failure: Secondary | ICD-10-CM | POA: Diagnosis not present

## 2015-08-01 DIAGNOSIS — I472 Ventricular tachycardia: Secondary | ICD-10-CM | POA: Diagnosis not present

## 2015-08-01 DIAGNOSIS — I509 Heart failure, unspecified: Secondary | ICD-10-CM | POA: Diagnosis not present

## 2015-08-01 DIAGNOSIS — Z95810 Presence of automatic (implantable) cardiac defibrillator: Secondary | ICD-10-CM | POA: Diagnosis not present

## 2015-08-01 DIAGNOSIS — I493 Ventricular premature depolarization: Secondary | ICD-10-CM | POA: Diagnosis not present

## 2015-08-01 DIAGNOSIS — Z79899 Other long term (current) drug therapy: Secondary | ICD-10-CM | POA: Diagnosis not present

## 2015-08-01 DIAGNOSIS — Z7901 Long term (current) use of anticoagulants: Secondary | ICD-10-CM | POA: Diagnosis not present

## 2015-08-01 DIAGNOSIS — I428 Other cardiomyopathies: Secondary | ICD-10-CM | POA: Diagnosis not present

## 2015-08-01 DIAGNOSIS — E785 Hyperlipidemia, unspecified: Secondary | ICD-10-CM | POA: Diagnosis not present

## 2015-08-02 DIAGNOSIS — I110 Hypertensive heart disease with heart failure: Secondary | ICD-10-CM | POA: Diagnosis not present

## 2015-08-02 DIAGNOSIS — I509 Heart failure, unspecified: Secondary | ICD-10-CM | POA: Diagnosis not present

## 2015-08-02 DIAGNOSIS — I472 Ventricular tachycardia: Secondary | ICD-10-CM | POA: Diagnosis not present

## 2015-08-02 DIAGNOSIS — E785 Hyperlipidemia, unspecified: Secondary | ICD-10-CM | POA: Diagnosis not present

## 2015-08-02 DIAGNOSIS — R9431 Abnormal electrocardiogram [ECG] [EKG]: Secondary | ICD-10-CM | POA: Diagnosis not present

## 2015-08-02 DIAGNOSIS — Z79899 Other long term (current) drug therapy: Secondary | ICD-10-CM | POA: Diagnosis not present

## 2015-08-02 DIAGNOSIS — I459 Conduction disorder, unspecified: Secondary | ICD-10-CM | POA: Diagnosis not present

## 2015-08-02 DIAGNOSIS — Z95810 Presence of automatic (implantable) cardiac defibrillator: Secondary | ICD-10-CM | POA: Diagnosis not present

## 2015-08-02 DIAGNOSIS — I493 Ventricular premature depolarization: Secondary | ICD-10-CM | POA: Diagnosis not present

## 2015-08-02 DIAGNOSIS — I428 Other cardiomyopathies: Secondary | ICD-10-CM | POA: Diagnosis not present

## 2015-08-02 DIAGNOSIS — Z7901 Long term (current) use of anticoagulants: Secondary | ICD-10-CM | POA: Diagnosis not present

## 2015-08-25 DIAGNOSIS — R7309 Other abnormal glucose: Secondary | ICD-10-CM | POA: Diagnosis not present

## 2015-08-25 DIAGNOSIS — E782 Mixed hyperlipidemia: Secondary | ICD-10-CM | POA: Diagnosis not present

## 2015-08-25 DIAGNOSIS — R7303 Prediabetes: Secondary | ICD-10-CM | POA: Diagnosis not present

## 2015-09-01 DIAGNOSIS — I10 Essential (primary) hypertension: Secondary | ICD-10-CM | POA: Diagnosis not present

## 2015-09-01 DIAGNOSIS — R7303 Prediabetes: Secondary | ICD-10-CM | POA: Diagnosis not present

## 2015-09-01 DIAGNOSIS — G4730 Sleep apnea, unspecified: Secondary | ICD-10-CM | POA: Diagnosis not present

## 2015-09-01 DIAGNOSIS — E782 Mixed hyperlipidemia: Secondary | ICD-10-CM | POA: Diagnosis not present

## 2015-09-15 DIAGNOSIS — I493 Ventricular premature depolarization: Secondary | ICD-10-CM | POA: Diagnosis not present

## 2015-09-15 DIAGNOSIS — I252 Old myocardial infarction: Secondary | ICD-10-CM | POA: Diagnosis not present

## 2015-09-15 DIAGNOSIS — I429 Cardiomyopathy, unspecified: Secondary | ICD-10-CM | POA: Diagnosis not present

## 2015-09-15 DIAGNOSIS — Z9889 Other specified postprocedural states: Secondary | ICD-10-CM | POA: Diagnosis not present

## 2015-09-15 DIAGNOSIS — I519 Heart disease, unspecified: Secondary | ICD-10-CM | POA: Diagnosis not present

## 2015-09-15 DIAGNOSIS — I10 Essential (primary) hypertension: Secondary | ICD-10-CM | POA: Diagnosis not present

## 2015-09-15 DIAGNOSIS — I480 Paroxysmal atrial fibrillation: Secondary | ICD-10-CM | POA: Diagnosis not present

## 2015-09-15 DIAGNOSIS — I5022 Chronic systolic (congestive) heart failure: Secondary | ICD-10-CM | POA: Diagnosis not present

## 2015-09-15 DIAGNOSIS — I472 Ventricular tachycardia: Secondary | ICD-10-CM | POA: Diagnosis not present

## 2015-09-16 DIAGNOSIS — R5383 Other fatigue: Secondary | ICD-10-CM | POA: Diagnosis not present

## 2015-09-16 DIAGNOSIS — I493 Ventricular premature depolarization: Secondary | ICD-10-CM | POA: Diagnosis not present

## 2015-10-03 DIAGNOSIS — R9431 Abnormal electrocardiogram [ECG] [EKG]: Secondary | ICD-10-CM | POA: Diagnosis not present

## 2015-10-03 DIAGNOSIS — R5383 Other fatigue: Secondary | ICD-10-CM | POA: Diagnosis not present

## 2015-10-03 DIAGNOSIS — I493 Ventricular premature depolarization: Secondary | ICD-10-CM | POA: Diagnosis not present

## 2015-10-03 DIAGNOSIS — I519 Heart disease, unspecified: Secondary | ICD-10-CM | POA: Diagnosis not present

## 2015-10-07 DIAGNOSIS — I472 Ventricular tachycardia: Secondary | ICD-10-CM | POA: Diagnosis not present

## 2015-11-11 DIAGNOSIS — R5383 Other fatigue: Secondary | ICD-10-CM | POA: Diagnosis not present

## 2015-11-11 DIAGNOSIS — I493 Ventricular premature depolarization: Secondary | ICD-10-CM | POA: Diagnosis not present

## 2015-11-11 DIAGNOSIS — I480 Paroxysmal atrial fibrillation: Secondary | ICD-10-CM | POA: Diagnosis not present

## 2015-11-13 DIAGNOSIS — Z45018 Encounter for adjustment and management of other part of cardiac pacemaker: Secondary | ICD-10-CM | POA: Diagnosis not present

## 2015-12-26 DIAGNOSIS — E782 Mixed hyperlipidemia: Secondary | ICD-10-CM | POA: Diagnosis not present

## 2015-12-26 DIAGNOSIS — I10 Essential (primary) hypertension: Secondary | ICD-10-CM | POA: Diagnosis not present

## 2015-12-26 DIAGNOSIS — R7303 Prediabetes: Secondary | ICD-10-CM | POA: Diagnosis not present

## 2016-01-05 DIAGNOSIS — R7303 Prediabetes: Secondary | ICD-10-CM | POA: Diagnosis not present

## 2016-01-05 DIAGNOSIS — E782 Mixed hyperlipidemia: Secondary | ICD-10-CM | POA: Diagnosis not present

## 2016-01-05 DIAGNOSIS — M1612 Unilateral primary osteoarthritis, left hip: Secondary | ICD-10-CM | POA: Diagnosis not present

## 2016-01-05 DIAGNOSIS — I10 Essential (primary) hypertension: Secondary | ICD-10-CM | POA: Diagnosis not present

## 2016-01-05 DIAGNOSIS — R1032 Left lower quadrant pain: Secondary | ICD-10-CM | POA: Diagnosis not present

## 2016-01-14 DIAGNOSIS — M1610 Unilateral primary osteoarthritis, unspecified hip: Secondary | ICD-10-CM | POA: Diagnosis not present

## 2016-01-19 DIAGNOSIS — I472 Ventricular tachycardia: Secondary | ICD-10-CM | POA: Diagnosis not present

## 2016-01-19 DIAGNOSIS — I429 Cardiomyopathy, unspecified: Secondary | ICD-10-CM | POA: Diagnosis not present

## 2016-01-19 DIAGNOSIS — I10 Essential (primary) hypertension: Secondary | ICD-10-CM | POA: Diagnosis not present

## 2016-01-19 DIAGNOSIS — Z95810 Presence of automatic (implantable) cardiac defibrillator: Secondary | ICD-10-CM | POA: Diagnosis not present

## 2016-01-19 DIAGNOSIS — Z9889 Other specified postprocedural states: Secondary | ICD-10-CM | POA: Diagnosis not present

## 2016-01-19 DIAGNOSIS — G4733 Obstructive sleep apnea (adult) (pediatric): Secondary | ICD-10-CM | POA: Diagnosis not present

## 2016-01-19 DIAGNOSIS — E782 Mixed hyperlipidemia: Secondary | ICD-10-CM | POA: Diagnosis not present

## 2016-01-19 DIAGNOSIS — I480 Paroxysmal atrial fibrillation: Secondary | ICD-10-CM | POA: Diagnosis not present

## 2016-01-19 DIAGNOSIS — I493 Ventricular premature depolarization: Secondary | ICD-10-CM | POA: Diagnosis not present

## 2016-01-28 ENCOUNTER — Encounter
Admission: RE | Admit: 2016-01-28 | Discharge: 2016-01-28 | Disposition: A | Discharge status: Home / Self Care | Payer: Commercial Managed Care - HMO | Source: Ambulatory Visit | Attending: Orthopedic Surgery | Admitting: Orthopedic Surgery

## 2016-01-28 DIAGNOSIS — M1612 Unilateral primary osteoarthritis, left hip: Secondary | ICD-10-CM | POA: Insufficient documentation

## 2016-01-28 DIAGNOSIS — Z0183 Encounter for blood typing: Secondary | ICD-10-CM | POA: Diagnosis not present

## 2016-01-28 DIAGNOSIS — Z01812 Encounter for preprocedural laboratory examination: Secondary | ICD-10-CM | POA: Insufficient documentation

## 2016-01-28 HISTORY — DX: Presence of automatic (implantable) cardiac defibrillator: Z95.810

## 2016-01-28 HISTORY — DX: Cardiac arrhythmia, unspecified: I49.9

## 2016-01-28 HISTORY — DX: Presence of cardiac pacemaker: Z95.0

## 2016-01-28 HISTORY — DX: Other complications of anesthesia, initial encounter: T88.59XA

## 2016-01-28 HISTORY — DX: Cardiomyopathy, unspecified: I42.9

## 2016-01-28 HISTORY — DX: Adverse effect of unspecified anesthetic, initial encounter: T41.45XA

## 2016-01-28 HISTORY — DX: Dyspnea, unspecified: R06.00

## 2016-01-28 LAB — BASIC METABOLIC PANEL
Anion gap: 7 (ref 5–15)
BUN: 21 mg/dL — AB (ref 6–20)
CALCIUM: 9.5 mg/dL (ref 8.9–10.3)
CO2: 28 mmol/L (ref 22–32)
CREATININE: 0.93 mg/dL (ref 0.61–1.24)
Chloride: 103 mmol/L (ref 101–111)
GFR calc non Af Amer: >60 mL/min (ref >60)
Glucose, Bld: 105 mg/dL — ABNORMAL HIGH (ref 65–99)
Potassium: 4.2 mmol/L (ref 3.5–5.1)
SODIUM: 138 mmol/L (ref 135–145)

## 2016-01-28 LAB — URINALYSIS COMPLETE WITH MICROSCOPIC (ARMC ONLY)
BACTERIA UA: NONE SEEN
Bilirubin Urine: NEGATIVE
GLUCOSE, UA: NEGATIVE mg/dL
HGB URINE DIPSTICK: NEGATIVE
KETONES UR: NEGATIVE mg/dL
LEUKOCYTES UA: NEGATIVE
NITRITE: NEGATIVE
PROTEIN: NEGATIVE mg/dL
RBC / HPF: NONE SEEN RBC/hpf (ref 0–5)
SPECIFIC GRAVITY, URINE: 1.019 (ref 1.005–1.030)
Squamous Epithelial / LPF: NONE SEEN
pH: 5 (ref 5.0–8.0)

## 2016-01-28 LAB — APTT: aPTT: 34 seconds (ref 24–36)

## 2016-01-28 LAB — CBC
HCT: 41.2 % (ref 40.0–52.0)
Hemoglobin: 14.5 g/dL (ref 13.0–18.0)
MCH: 32.9 pg (ref 26.0–34.0)
MCHC: 35.2 g/dL (ref 32.0–36.0)
MCV: 93.6 fL (ref 80.0–100.0)
PLATELETS: 207 10*3/uL (ref 150–440)
RBC: 4.41 MIL/uL (ref 4.40–5.90)
RDW: 13.1 % (ref 11.5–14.5)
WBC: 7.9 10*3/uL (ref 3.8–10.6)

## 2016-01-28 LAB — PROTIME-INR
INR: 1.01
PROTHROMBIN TIME: 13.3 s (ref 11.4–15.2)

## 2016-01-28 LAB — TYPE AND SCREEN
ABO/RH(D): AB POS
Antibody Screen: NEGATIVE

## 2016-01-28 LAB — SURGICAL PCR SCREEN
MRSA, PCR: NEGATIVE
STAPHYLOCOCCUS AUREUS: NEGATIVE

## 2016-01-28 NOTE — Patient Instructions (Signed)
  Your procedure is scheduled on: 02/10/16 Report to Day Surgery. [facility] To find out your arrival time please call [phone] between 1PM - 3PM on 02/09/16.  Remember: Instructions that are not followed completely may result in serious medical risk, up to and including death, or upon the discretion of your surgeon and anesthesiologist your surgery may need to be rescheduled.    _X___ 1. Do not eat food or drink liquids after midnight. No gum chewing or hard candies.     _X___ 2. No Alcohol for 24 hours before or after surgery.   ___X_ 3. Do Not Smoke For 24 Hours Prior to Your Surgery.   ____ 4. Bring all medications with you on the day of surgery if instructed.    _X___ 5. Notify your doctor if there is any change in your medical condition     (cold, fever, infections).       Do not wear jewelry, make-up, hairpins, clips or nail polish.  Do not wear lotions, powders, or perfumes. You may wear deodorant.  Do not shave 48 hours prior to surgery. Men may shave face and neck.  Do not bring valuables to the hospital.    [facility] is not responsible for any belongings or valuables.               Contacts, dentures or bridgework may not be worn into surgery.  Leave your suitcase in the car. After surgery it may be brought to your room.  For patients admitted to the hospital, discharge time is determined by your                treatment team.   Patients discharged the day of surgery will not be allowed to drive home.   Please read over the following fact sheets that you were given:                                                                                                  MRSA   __X__ Take these medicines the morning of surgery with A SIP OF WATER:    1.LISINOPRIL  2. SOTALOL  3.   4.  5.  6.  ____ Fleet Enema (as directed)   _X___ Use CHG Soap as directed  ____ Use inhalers on the day of surgery  ____ Stop metformin 2 days prior to  surgery    ____ Take 1/2 of usual insulin dose the night before surgery and none on the morning of surgery.   ___X_ Stop Coumadin/Plavix/aspirin on    STOP ELIQUIS 3 DAYS BEFORE SURGERY  ____ Stop Anti-inflammatories on    ____ Stop supplements until after surgery.    ____ Bring C-Pap to the hospital.

## 2016-01-28 NOTE — Pre-Procedure Instructions (Signed)
CLEARED LOW RISK BY DR PARASCHOS 01/19/16

## 2016-01-29 DIAGNOSIS — I4891 Unspecified atrial fibrillation: Secondary | ICD-10-CM | POA: Diagnosis not present

## 2016-01-29 LAB — URINE CULTURE: CULTURE: NO GROWTH

## 2016-02-10 ENCOUNTER — Encounter
Admission: RE | Disposition: A | Discharge status: Home Health | Payer: Self-pay | Source: Ambulatory Visit | Attending: Orthopedic Surgery

## 2016-02-10 ENCOUNTER — Inpatient Hospital Stay: Payer: Commercial Managed Care - HMO | Admitting: Certified Registered Nurse Anesthetist

## 2016-02-10 ENCOUNTER — Encounter: Payer: Self-pay | Admitting: Anesthesiology

## 2016-02-10 ENCOUNTER — Inpatient Hospital Stay
Admission: RE | Admit: 2016-02-10 | Discharge: 2016-02-12 | DRG: 470 | Disposition: A | Discharge status: Home Health | Payer: Commercial Managed Care - HMO | Source: Ambulatory Visit | Attending: Orthopedic Surgery | Admitting: Orthopedic Surgery

## 2016-02-10 ENCOUNTER — Inpatient Hospital Stay: Payer: Commercial Managed Care - HMO

## 2016-02-10 DIAGNOSIS — I2510 Atherosclerotic heart disease of native coronary artery without angina pectoris: Secondary | ICD-10-CM | POA: Diagnosis present

## 2016-02-10 DIAGNOSIS — G4730 Sleep apnea, unspecified: Secondary | ICD-10-CM | POA: Diagnosis not present

## 2016-02-10 DIAGNOSIS — Z95810 Presence of automatic (implantable) cardiac defibrillator: Secondary | ICD-10-CM

## 2016-02-10 DIAGNOSIS — I509 Heart failure, unspecified: Secondary | ICD-10-CM | POA: Diagnosis not present

## 2016-02-10 DIAGNOSIS — Z419 Encounter for procedure for purposes other than remedying health state, unspecified: Secondary | ICD-10-CM

## 2016-02-10 DIAGNOSIS — Z471 Aftercare following joint replacement surgery: Secondary | ICD-10-CM | POA: Diagnosis not present

## 2016-02-10 DIAGNOSIS — Z96642 Presence of left artificial hip joint: Secondary | ICD-10-CM | POA: Diagnosis not present

## 2016-02-10 DIAGNOSIS — I5022 Chronic systolic (congestive) heart failure: Secondary | ICD-10-CM | POA: Diagnosis not present

## 2016-02-10 DIAGNOSIS — M1612 Unilateral primary osteoarthritis, left hip: Principal | ICD-10-CM | POA: Diagnosis present

## 2016-02-10 DIAGNOSIS — G8918 Other acute postprocedural pain: Secondary | ICD-10-CM

## 2016-02-10 DIAGNOSIS — I420 Dilated cardiomyopathy: Secondary | ICD-10-CM | POA: Diagnosis present

## 2016-02-10 DIAGNOSIS — M6281 Muscle weakness (generalized): Secondary | ICD-10-CM

## 2016-02-10 DIAGNOSIS — Z79899 Other long term (current) drug therapy: Secondary | ICD-10-CM

## 2016-02-10 DIAGNOSIS — R2689 Other abnormalities of gait and mobility: Secondary | ICD-10-CM

## 2016-02-10 DIAGNOSIS — I252 Old myocardial infarction: Secondary | ICD-10-CM | POA: Diagnosis not present

## 2016-02-10 DIAGNOSIS — I110 Hypertensive heart disease with heart failure: Secondary | ICD-10-CM | POA: Diagnosis present

## 2016-02-10 DIAGNOSIS — M25552 Pain in left hip: Secondary | ICD-10-CM | POA: Diagnosis present

## 2016-02-10 HISTORY — PX: TOTAL HIP ARTHROPLASTY: SHX124

## 2016-02-10 LAB — ABO/RH: ABO/RH(D): AB POS

## 2016-02-10 LAB — CBC
HEMATOCRIT: 40.6 % (ref 40.0–52.0)
HEMOGLOBIN: 13.8 g/dL (ref 13.0–18.0)
MCH: 32.1 pg (ref 26.0–34.0)
MCHC: 34.1 g/dL (ref 32.0–36.0)
MCV: 94.2 fL (ref 80.0–100.0)
Platelets: 186 10*3/uL (ref 150–440)
RBC: 4.31 MIL/uL — AB (ref 4.40–5.90)
RDW: 12.9 % (ref 11.5–14.5)
WBC: 14.2 10*3/uL — AB (ref 3.8–10.6)

## 2016-02-10 LAB — CREATININE, SERUM: Creatinine, Ser: 0.83 mg/dL (ref 0.61–1.24)

## 2016-02-10 SURGERY — ARTHROPLASTY, HIP, TOTAL, ANTERIOR APPROACH
Anesthesia: Spinal | Site: Hip | Laterality: Left | Wound class: Clean

## 2016-02-10 MED ORDER — DEXTROSE 5 % IV SOLN
500.0000 mg | Freq: Four times a day (QID) | INTRAVENOUS | Status: DC | PRN
  Filled 2016-02-10: qty 5

## 2016-02-10 MED ORDER — SODIUM CHLORIDE 0.9 % IV SOLN
INTRAVENOUS | Status: DC | PRN
  Administered 2016-02-10: 1000 mg via INTRAVENOUS

## 2016-02-10 MED ORDER — BUPIVACAINE-EPINEPHRINE 0.25% -1:200000 IJ SOLN
INTRAMUSCULAR | Status: DC | PRN
  Administered 2016-02-10: 30 mL

## 2016-02-10 MED ORDER — FAMOTIDINE 20 MG PO TABS
ORAL_TABLET | ORAL | Status: AC
Start: 2016-02-10 — End: 2016-02-10
  Administered 2016-02-10: 20 mg via ORAL
  Filled 2016-02-10: qty 1

## 2016-02-10 MED ORDER — CEFAZOLIN SODIUM-DEXTROSE 2-4 GM/100ML-% IV SOLN
2.0000 g | Freq: Four times a day (QID) | INTRAVENOUS | Status: AC
  Administered 2016-02-10 – 2016-02-11 (×3): 2 g via INTRAVENOUS
  Filled 2016-02-10 (×3): qty 100

## 2016-02-10 MED ORDER — SODIUM CHLORIDE 0.9 % IV SOLN
INTRAVENOUS | Status: DC
  Administered 2016-02-10: 17:00:00 via INTRAVENOUS

## 2016-02-10 MED ORDER — ONDANSETRON HCL 4 MG/2ML IJ SOLN: 4.0000 mg | Freq: Four times a day (QID) | INTRAMUSCULAR | Status: DC | PRN

## 2016-02-10 MED ORDER — LABETALOL HCL 5 MG/ML IV SOLN
INTRAVENOUS | Status: AC
  Administered 2016-02-10: 10 mg via INTRAVENOUS
  Filled 2016-02-10: qty 4

## 2016-02-10 MED ORDER — FENTANYL CITRATE (PF) 100 MCG/2ML IJ SOLN
INTRAMUSCULAR | Status: AC
  Filled 2016-02-10: qty 2

## 2016-02-10 MED ORDER — PROPOFOL 500 MG/50ML IV EMUL
INTRAVENOUS | Status: DC | PRN
  Administered 2016-02-10: 50 ug/kg/min via INTRAVENOUS

## 2016-02-10 MED ORDER — FENTANYL CITRATE (PF) 100 MCG/2ML IJ SOLN
25.0000 ug | INTRAMUSCULAR | Status: DC | PRN
  Administered 2016-02-10 (×4): 25 ug via INTRAVENOUS

## 2016-02-10 MED ORDER — BUPIVACAINE-EPINEPHRINE (PF) 0.25% -1:200000 IJ SOLN
INTRAMUSCULAR | Status: AC
  Filled 2016-02-10: qty 30

## 2016-02-10 MED ORDER — MORPHINE SULFATE (PF) 2 MG/ML IV SOLN: 2.0000 mg | INTRAVENOUS | Status: DC | PRN

## 2016-02-10 MED ORDER — BUPIVACAINE IN DEXTROSE 0.75-8.25 % IT SOLN
INTRATHECAL | Status: DC | PRN
  Administered 2016-02-10: 1.6 mL via INTRATHECAL

## 2016-02-10 MED ORDER — ADULT MULTIVITAMIN W/MINERALS CH
1.0000 | ORAL_TABLET | Freq: Every day | ORAL | Status: DC
  Administered 2016-02-11 – 2016-02-12 (×2): 1 via ORAL
  Filled 2016-02-10 (×2): qty 1

## 2016-02-10 MED ORDER — NEOMYCIN-POLYMYXIN B GU 40-200000 IR SOLN
Status: AC
  Filled 2016-02-10: qty 4

## 2016-02-10 MED ORDER — VITAMIN B-12 1000 MCG PO TABS
1000.0000 ug | ORAL_TABLET | Freq: Every day | ORAL | Status: DC
  Administered 2016-02-11 – 2016-02-12 (×2): 1000 ug via ORAL
  Filled 2016-02-10 (×2): qty 1

## 2016-02-10 MED ORDER — PROPOFOL 10 MG/ML IV BOLUS
INTRAVENOUS | Status: DC | PRN
  Administered 2016-02-10 (×3): 20 mg via INTRAVENOUS

## 2016-02-10 MED ORDER — NEOMYCIN-POLYMYXIN B GU 40-200000 IR SOLN
Status: AC
  Filled 2016-02-10: qty 20

## 2016-02-10 MED ORDER — ZOLPIDEM TARTRATE 5 MG PO TABS
5.0000 mg | ORAL_TABLET | Freq: Every evening | ORAL | Status: DC | PRN
  Administered 2016-02-10: 5 mg via ORAL
  Filled 2016-02-10: qty 1

## 2016-02-10 MED ORDER — ACETAMINOPHEN 325 MG PO TABS: 650.0000 mg | ORAL_TABLET | Freq: Four times a day (QID) | ORAL | Status: DC | PRN

## 2016-02-10 MED ORDER — ONDANSETRON HCL 4 MG PO TABS: 4.0000 mg | ORAL_TABLET | Freq: Four times a day (QID) | ORAL | Status: DC | PRN

## 2016-02-10 MED ORDER — BISACODYL 10 MG RE SUPP
10.0000 mg | Freq: Every day | RECTAL | Status: DC | PRN
  Administered 2016-02-12: 10 mg via RECTAL
  Filled 2016-02-10: qty 1

## 2016-02-10 MED ORDER — CEFAZOLIN SODIUM-DEXTROSE 2-4 GM/100ML-% IV SOLN
2.0000 g | Freq: Once | INTRAVENOUS | Status: AC
Start: 2016-02-10 — End: 2016-02-10
  Administered 2016-02-10: 2 g via INTRAVENOUS

## 2016-02-10 MED ORDER — METOCLOPRAMIDE HCL 10 MG PO TABS: 5.0000 mg | ORAL_TABLET | Freq: Three times a day (TID) | ORAL | Status: DC | PRN

## 2016-02-10 MED ORDER — CEFAZOLIN SODIUM-DEXTROSE 2-4 GM/100ML-% IV SOLN
INTRAVENOUS | Status: AC
  Filled 2016-02-10: qty 100

## 2016-02-10 MED ORDER — VITAMIN E 180 MG (400 UNIT) PO CAPS
400.0000 [IU] | ORAL_CAPSULE | Freq: Every day | ORAL | Status: DC
  Administered 2016-02-11 – 2016-02-12 (×2): 400 [IU] via ORAL
  Filled 2016-02-10 (×2): qty 1

## 2016-02-10 MED ORDER — OXYCODONE HCL 5 MG PO TABS
5.0000 mg | ORAL_TABLET | ORAL | Status: DC | PRN
  Administered 2016-02-10: 10 mg via ORAL
  Administered 2016-02-10 (×2): 5 mg via ORAL
  Administered 2016-02-11: 10 mg via ORAL
  Administered 2016-02-11 (×2): 5 mg via ORAL
  Administered 2016-02-12: 10 mg via ORAL
  Filled 2016-02-10 (×2): qty 1
  Filled 2016-02-10: qty 2
  Filled 2016-02-10: qty 1
  Filled 2016-02-10 (×2): qty 2
  Filled 2016-02-10: qty 1

## 2016-02-10 MED ORDER — LACTATED RINGERS IV SOLN
INTRAVENOUS | Status: DC
  Administered 2016-02-10: 11:00:00 via INTRAVENOUS

## 2016-02-10 MED ORDER — MIDAZOLAM HCL 5 MG/5ML IJ SOLN
INTRAMUSCULAR | Status: DC | PRN
  Administered 2016-02-10: 2 mg via INTRAVENOUS

## 2016-02-10 MED ORDER — ACETAMINOPHEN 10 MG/ML IV SOLN
INTRAVENOUS | Status: AC
  Filled 2016-02-10: qty 100

## 2016-02-10 MED ORDER — FENTANYL CITRATE (PF) 100 MCG/2ML IJ SOLN
INTRAMUSCULAR | Status: DC | PRN
  Administered 2016-02-10: 25 ug via INTRAVENOUS
  Administered 2016-02-10: 50 ug via INTRAVENOUS
  Administered 2016-02-10: 25 ug via INTRAVENOUS

## 2016-02-10 MED ORDER — APIXABAN 5 MG PO TABS
5.0000 mg | ORAL_TABLET | Freq: Two times a day (BID) | ORAL | Status: DC
  Administered 2016-02-11 – 2016-02-12 (×3): 5 mg via ORAL
  Filled 2016-02-10 (×4): qty 1

## 2016-02-10 MED ORDER — HYDROMORPHONE HCL 1 MG/ML IJ SOLN
INTRAMUSCULAR | Status: AC
  Filled 2016-02-10: qty 1

## 2016-02-10 MED ORDER — VITAMIN C 500 MG PO TABS
500.0000 mg | ORAL_TABLET | Freq: Every day | ORAL | Status: DC
  Administered 2016-02-11 – 2016-02-12 (×2): 500 mg via ORAL
  Filled 2016-02-10 (×2): qty 1

## 2016-02-10 MED ORDER — ONDANSETRON HCL 4 MG/2ML IJ SOLN: 4.0000 mg | Freq: Once | INTRAMUSCULAR | Status: DC | PRN

## 2016-02-10 MED ORDER — ACETAMINOPHEN 650 MG RE SUPP: 650.0000 mg | Freq: Four times a day (QID) | RECTAL | Status: DC | PRN

## 2016-02-10 MED ORDER — ACETAMINOPHEN 10 MG/ML IV SOLN
INTRAVENOUS | Status: DC | PRN
  Administered 2016-02-10: 1000 mg via INTRAVENOUS

## 2016-02-10 MED ORDER — LISINOPRIL 5 MG PO TABS
5.0000 mg | ORAL_TABLET | Freq: Every day | ORAL | Status: DC
  Administered 2016-02-11 – 2016-02-12 (×2): 5 mg via ORAL
  Filled 2016-02-10 (×3): qty 1

## 2016-02-10 MED ORDER — MENTHOL 3 MG MT LOZG
1.0000 | LOZENGE | OROMUCOSAL | Status: DC | PRN
  Filled 2016-02-10: qty 9

## 2016-02-10 MED ORDER — SOTALOL HCL 80 MG PO TABS
80.0000 mg | ORAL_TABLET | Freq: Two times a day (BID) | ORAL | Status: DC
  Administered 2016-02-10 – 2016-02-12 (×4): 80 mg via ORAL
  Filled 2016-02-10 (×4): qty 1

## 2016-02-10 MED ORDER — VITAMIN D 1000 UNITS PO TABS
2000.0000 [IU] | ORAL_TABLET | Freq: Every day | ORAL | Status: DC
  Administered 2016-02-11 – 2016-02-12 (×2): 2000 [IU] via ORAL
  Filled 2016-02-10 (×4): qty 2

## 2016-02-10 MED ORDER — HYDRALAZINE HCL 20 MG/ML IJ SOLN
INTRAMUSCULAR | Status: AC
  Filled 2016-02-10: qty 1

## 2016-02-10 MED ORDER — MAGNESIUM CITRATE PO SOLN
1.0000 | Freq: Once | ORAL | Status: DC | PRN
  Filled 2016-02-10: qty 296

## 2016-02-10 MED ORDER — PHENYLEPHRINE HCL 10 MG/ML IJ SOLN
INTRAMUSCULAR | Status: DC | PRN
  Administered 2016-02-10: 30 ug/min via INTRAVENOUS

## 2016-02-10 MED ORDER — FAMOTIDINE 20 MG PO TABS
20.0000 mg | ORAL_TABLET | Freq: Once | ORAL | Status: AC
  Administered 2016-02-10: 20 mg via ORAL

## 2016-02-10 MED ORDER — NEOMYCIN-POLYMYXIN B GU 40-200000 IR SOLN
Status: DC | PRN
  Administered 2016-02-10: 4 mL
  Administered 2016-02-10: 12 mL

## 2016-02-10 MED ORDER — DIPHENHYDRAMINE HCL 12.5 MG/5ML PO ELIX
12.5000 mg | ORAL_SOLUTION | ORAL | Status: DC | PRN
Start: 2016-02-10 — End: 2016-02-12

## 2016-02-10 MED ORDER — DOCUSATE SODIUM 100 MG PO CAPS
100.0000 mg | ORAL_CAPSULE | Freq: Two times a day (BID) | ORAL | Status: DC
  Administered 2016-02-10 – 2016-02-12 (×4): 100 mg via ORAL
  Filled 2016-02-10 (×4): qty 1

## 2016-02-10 MED ORDER — HYDRALAZINE HCL 20 MG/ML IJ SOLN
10.0000 mg | Freq: Once | INTRAMUSCULAR | Status: AC
  Administered 2016-02-10: 10 mg via INTRAVENOUS

## 2016-02-10 MED ORDER — METOCLOPRAMIDE HCL 5 MG/ML IJ SOLN: 5.0000 mg | Freq: Three times a day (TID) | INTRAMUSCULAR | Status: DC | PRN

## 2016-02-10 MED ORDER — PRAVASTATIN SODIUM 40 MG PO TABS
40.0000 mg | ORAL_TABLET | Freq: Every day | ORAL | Status: DC
  Administered 2016-02-10 – 2016-02-11 (×2): 40 mg via ORAL
  Filled 2016-02-10 (×2): qty 1

## 2016-02-10 MED ORDER — PHENOL 1.4 % MT LIQD
1.0000 | OROMUCOSAL | Status: DC | PRN
  Filled 2016-02-10: qty 177

## 2016-02-10 MED ORDER — FLUTICASONE PROPIONATE 50 MCG/ACT NA SUSP
2.0000 | Freq: Every day | NASAL | Status: DC | PRN
  Filled 2016-02-10: qty 16

## 2016-02-10 MED ORDER — TRANEXAMIC ACID 1000 MG/10ML IV SOLN
INTRAVENOUS | Status: AC
  Filled 2016-02-10: qty 10

## 2016-02-10 MED ORDER — HYDROMORPHONE HCL 1 MG/ML IJ SOLN
0.5000 mg | INTRAMUSCULAR | Status: AC | PRN
  Administered 2016-02-10 (×4): 0.5 mg via INTRAVENOUS

## 2016-02-10 MED ORDER — LABETALOL HCL 5 MG/ML IV SOLN
10.0000 mg | Freq: Once | INTRAVENOUS | Status: AC
  Administered 2016-02-10: 10 mg via INTRAVENOUS

## 2016-02-10 MED ORDER — MAGNESIUM HYDROXIDE 400 MG/5ML PO SUSP
30.0000 mL | Freq: Every day | ORAL | Status: DC | PRN
  Administered 2016-02-11: 30 mL via ORAL
  Filled 2016-02-10 (×2): qty 30

## 2016-02-10 MED ORDER — METHOCARBAMOL 500 MG PO TABS: 500.0000 mg | ORAL_TABLET | Freq: Four times a day (QID) | ORAL | Status: DC | PRN

## 2016-02-10 MED ORDER — HYDROMORPHONE HCL 1 MG/ML IJ SOLN
INTRAMUSCULAR | Status: AC
Start: 2016-02-10 — End: 2016-02-10
  Administered 2016-02-10: 0.5 mg via INTRAVENOUS
  Filled 2016-02-10: qty 1

## 2016-02-10 SURGICAL SUPPLY — 53 items
BLADE SAW SAG 18.5X105 (BLADE) ×3 IMPLANT
BNDG COHESIVE 6X5 TAN STRL LF (GAUZE/BANDAGES/DRESSINGS) ×6 IMPLANT
CANISTER SUCT 1200ML W/VALVE (MISCELLANEOUS) ×3 IMPLANT
CAPT HIP TOTAL 3 ×3 IMPLANT
CATH FOL LEG HOLDER (MISCELLANEOUS) ×3 IMPLANT
CATH TRAY METER 16FR LF (MISCELLANEOUS) ×3 IMPLANT
CHLORAPREP W/TINT 26ML (MISCELLANEOUS) ×3 IMPLANT
DRAPE C-ARM XRAY 36X54 (DRAPES) ×3 IMPLANT
DRAPE INCISE IOBAN 66X60 STRL (DRAPES) IMPLANT
DRAPE POUCH INSTRU U-SHP 10X18 (DRAPES) ×3 IMPLANT
DRAPE SHEET LG 3/4 BI-LAMINATE (DRAPES) ×9 IMPLANT
DRAPE STERI IOBAN 125X83 (DRAPES) IMPLANT
DRAPE TABLE BACK 80X90 (DRAPES) ×3 IMPLANT
DRSG OPSITE POSTOP 4X8 (GAUZE/BANDAGES/DRESSINGS) ×3 IMPLANT
ELECT BLADE 6.5 EXT (BLADE) ×3 IMPLANT
GAUZE SPONGE 4X4 12PLY STRL (GAUZE/BANDAGES/DRESSINGS) IMPLANT
GLOVE BIOGEL PI IND STRL 9 (GLOVE) ×1 IMPLANT
GLOVE BIOGEL PI INDICATOR 9 (GLOVE) ×2
GLOVE SURG SYN 9.0  PF PI (GLOVE) ×4
GLOVE SURG SYN 9.0 PF PI (GLOVE) ×2 IMPLANT
GOWN SRG 2XL LVL 4 RGLN SLV (GOWNS) ×1 IMPLANT
GOWN STRL NON-REIN 2XL LVL4 (GOWNS) ×2
GOWN STRL REUS W/ TWL LRG LVL3 (GOWN DISPOSABLE) ×1 IMPLANT
GOWN STRL REUS W/TWL LRG LVL3 (GOWN DISPOSABLE) ×2
HANDPIECE INTERPULSE COAX TIP (DISPOSABLE) ×2
HEMOVAC 400CC 10FR (MISCELLANEOUS) IMPLANT
HOOD PEEL AWAY FLYTE STAYCOOL (MISCELLANEOUS) ×3 IMPLANT
IV LACTATED RINGER IRRG 3000ML (IV SOLUTION)
IV LR IRRIG 3000ML ARTHROMATIC (IV SOLUTION) IMPLANT
MAT BLUE FLOOR 46X72 FLO (MISCELLANEOUS) ×3 IMPLANT
NDL SAFETY 18GX1.5 (NEEDLE) ×3 IMPLANT
NEEDLE FILTER BLUNT 18X 1/2SAF (NEEDLE) ×2
NEEDLE FILTER BLUNT 18X1 1/2 (NEEDLE) ×1 IMPLANT
NEEDLE SPNL 18GX3.5 QUINCKE PK (NEEDLE) ×3 IMPLANT
NS IRRIG 1000ML POUR BTL (IV SOLUTION) ×3 IMPLANT
PACK HIP COMPR (MISCELLANEOUS) ×3 IMPLANT
SET HNDPC FAN SPRY TIP SCT (DISPOSABLE) ×1 IMPLANT
SOL PREP PVP 2OZ (MISCELLANEOUS) ×3
SOLUTION PREP PVP 2OZ (MISCELLANEOUS) ×1 IMPLANT
STAPLER SKIN PROX 35W (STAPLE) ×3 IMPLANT
STRAP SAFETY BODY (MISCELLANEOUS) ×3 IMPLANT
SUT DVC 2 QUILL PDO  T11 36X36 (SUTURE) ×2
SUT DVC 2 QUILL PDO T11 36X36 (SUTURE) ×1 IMPLANT
SUT DVC QUILL MONODERM 30X30 (SUTURE) ×3 IMPLANT
SUT SILK 0 (SUTURE) ×3
SUT SILK 0 30XBRD TIE 6 (SUTURE) ×1 IMPLANT
SUT VIC AB 1 CT1 36 (SUTURE) ×6 IMPLANT
SYR 20CC LL (SYRINGE) ×3 IMPLANT
SYR 30ML LL (SYRINGE) ×3 IMPLANT
SYRINGE 10CC LL (SYRINGE) ×3 IMPLANT
TAPE MICROFOAM 4IN (TAPE) IMPLANT
TOWEL OR 17X26 4PK STRL BLUE (TOWEL DISPOSABLE) ×3 IMPLANT
TUBE KAMVAC SUCTION (TUBING) ×3 IMPLANT

## 2016-02-10 NOTE — Transfer of Care (Signed)
3Immediate Anesthesia Transfer of Care Note  Patient: Jay [name]  Procedure(s) Performed: Procedure(s): TOTAL HIP ARTHROPLASTY ANTERIOR APPROACH (Left)  Patient Location: PACU  Anesthesia Type:Spinal  Level of Consciousness: sedated  Airway & Oxygen Therapy: Patient Spontanous Breathing and Patient connected to face mask oxygen  Post-op Assessment: Report given to RN and Post -op Vital signs reviewed and stable  Post vital signs: Reviewed and stable  Last Vitals:  Vitals:   02/10/16 1049 02/10/16 1411  BP: 118/82 (!) 141/84  Pulse: 79 70  Resp: 16 16  Temp: 36.7 C 36.6 C    Last Pain:  Vitals:   02/10/16 1411  TempSrc: Temporal  PainSc:          Complications: No apparent anesthesia complications

## 2016-02-10 NOTE — H&P (Signed)
Reviewed paper H+P, will be scanned into chart. No changes noted.  

## 2016-02-10 NOTE — Anesthesia Procedure Notes (Signed)
Date/Time: 02/10/2016 12:38 PM Performed by: Johnna Acosta Pre-anesthesia Checklist: Patient identified, Emergency Drugs available, Suction available, Patient being monitored and Timeout performed Patient Re-evaluated:Patient Re-evaluated prior to inductionOxygen Delivery Method: Simple face mask Preoxygenation: Pre-oxygenation with 100% oxygen

## 2016-02-10 NOTE — Anesthesia Procedure Notes (Signed)
Spinal  Patient location during procedure: OR Staffing Anesthesiologist: Takao Lizer Performed: anesthesiologist  Preanesthetic Checklist Completed: patient identified, site marked, surgical consent, pre-op evaluation, timeout performed, IV checked and risks and benefits discussed Spinal Block Patient position: sitting Prep: Betadine Patient monitoring: heart rate, cardiac monitor, continuous pulse ox and blood pressure Approach: midline Location: L3-4 Injection technique: single-shot Needle Needle type: Pencil-Tip  Needle gauge: 25 G Needle length: 9 cm Assessment Sensory level: T10     

## 2016-02-10 NOTE — Anesthesia Preprocedure Evaluation (Addendum)
Anesthesia Evaluation  Patient identified by MRN, date of birth, ID band Patient awake    Reviewed: Allergy & Precautions, NPO status , Patient's Chart, lab work & pertinent test results, reviewed documented beta blocker date and time   Airway Mallampati: III  TM Distance: >3 FB     Dental  (+) Chipped, Missing, Dental Advisory Given, Poor Dentition   Pulmonary shortness of breath, sleep apnea ,           Cardiovascular hypertension, Pt. on medications + CAD, + Past MI and +CHF  + dysrhythmias Ventricular Tachycardia + pacemaker + Cardiac Defibrillator      Neuro/Psych    GI/Hepatic   Endo/Other    Renal/GU      Musculoskeletal   Abdominal   Peds  Hematology   Anesthesia Other Findings Hx of vt, with defib placement. Has stopped eliquis  4 days, so I will proceed with regional. Does not use CPAP. Denies anesthesia complications. No stent placement. Has had an ablation done for AF with rapid rate of 250.  Reproductive/Obstetrics                            Anesthesia Physical Anesthesia Plan  ASA: III  Anesthesia Plan: Spinal   Post-op Pain Management:    Induction:   Airway Management Planned:   Additional Equipment:   Intra-op Plan:   Post-operative Plan:   Informed Consent: I have reviewed the patients History and Physical, chart, labs and discussed the procedure including the risks, benefits and alternatives for the proposed anesthesia with the patient or authorized representative who has indicated his/her understanding and acceptance.     Plan Discussed with: CRNA  Anesthesia Plan Comments:         Anesthesia Quick Evaluation

## 2016-02-10 NOTE — Op Note (Signed)
02/10/2016  2:11 PM  PATIENT:  [name]  65 y.o. male  PRE-OPERATIVE DIAGNOSIS:  PRIMARY OSTEOARTHRITIS left hip  POST-OPERATIVE DIAGNOSIS:  PRIMARY OSTEOARTHRITIS left hip  PROCEDURE:  Procedure(s): TOTAL HIP ARTHROPLASTY ANTERIOR APPROACH (Left)  SURGEON: Laurene Footman, MD  ASSISTANTS: None  ANESTHESIA:   spinal  EBL:  Total I/O In: -  Out: 500 [Urine:300; Blood:200]  BLOOD ADMINISTERED:none  DRAINS: none   LOCAL MEDICATIONS USED:  MARCAINE     SPECIMEN:  Source of Specimen:  Left femoral head  DISPOSITION OF SPECIMEN:  PATHOLOGY  COUNTS:  YES  TOURNIQUET:  * No tourniquets in log *  IMPLANTS: [place] for standard stem with 60 mm Mpact cup DM with liner and L 28 mm head  DICTATION: .Dragon Dictation   The patient was brought to the operating room and after spinal anesthesia was obtained patient was placed on the operative table with the ipsilateral foot into the Medacta attachment, contralateral leg on a well-padded table. C-arm was brought in and preop template x-ray taken. After prepping and draping in usual sterile fashion appropriate patient identification and timeout procedures were completed. Anterior approach to the hip was obtained and centered over the greater trochanter and TFL muscle. The subcutaneous tissue was incised hemostasis being achieved by electrocautery. TFL fascia was incised and the muscle retracted laterally deep retractor placed. The lateral femoral circumflex vessels were identified and ligated. The anterior capsule was exposed and a capsulotomy performed. The neck was identified and a femoral neck cut carried out with a saw. The head was removed without difficulty and showed sclerotic femoral head and acetabulum. Reaming was carried out to 60 mm and a 60 mm cup trial gave appropriate tightness to the acetabular component a 60 DM cup was impacted into position. The leg was then externally rotated and ischiofemoral and pubofemoral  releases carried out. The femur was sequentially broached to a size 4, size 4 standard femur and S 28 head trials were placed and the final components chosen. The 4 standard stem was inserted along with a L 28 mm head and 60 mm liner. The hip was reduced and was stable the wound was thoroughly irrigated . The deep fascia was closed using a heavy Quill after infiltration of 30 cc of quarter percent Sensorcaine with epinephrine, TXA left in the joint.Marland Kitchen 3-0 v-loc l to close the skin with skin staples Xeroform and honeycomb dressing applied  PLAN OF CARE: Admit to inpatient

## 2016-02-11 ENCOUNTER — Encounter: Payer: Self-pay | Admitting: Orthopedic Surgery

## 2016-02-11 LAB — BASIC METABOLIC PANEL
ANION GAP: 5 (ref 5–15)
BUN: 9 mg/dL (ref 6–20)
CO2: 29 mmol/L (ref 22–32)
Calcium: 8.5 mg/dL — ABNORMAL LOW (ref 8.9–10.3)
Chloride: 104 mmol/L (ref 101–111)
Creatinine, Ser: 0.95 mg/dL (ref 0.61–1.24)
Glucose, Bld: 149 mg/dL — ABNORMAL HIGH (ref 65–99)
POTASSIUM: 4.1 mmol/L (ref 3.5–5.1)
SODIUM: 138 mmol/L (ref 135–145)

## 2016-02-11 LAB — CBC
HCT: 37.8 % — ABNORMAL LOW (ref 40.0–52.0)
Hemoglobin: 13.2 g/dL (ref 13.0–18.0)
MCH: 33 pg (ref 26.0–34.0)
MCHC: 35 g/dL (ref 32.0–36.0)
MCV: 94.3 fL (ref 80.0–100.0)
PLATELETS: 175 10*3/uL (ref 150–440)
RBC: 4.01 MIL/uL — AB (ref 4.40–5.90)
RDW: 12.8 % (ref 11.5–14.5)
WBC: 9.8 10*3/uL (ref 3.8–10.6)

## 2016-02-11 NOTE — Progress Notes (Signed)
Clinical Social Worker (CSW) received SNF consult. PT is recommending home health. RN case manager is aware of above. Please reconsult if future social work needs arise. CSW signing off.   Herman Fiero, LCSW [phone] 

## 2016-02-11 NOTE — Evaluation (Signed)
Occupational Therapy Evaluation Patient Details Name: Jay [name] MRN: [id number] DOB: [birth year] Today's Date: 02/11/2016    History of Present Illness Pt. is a 65 y.o. male who was admitted for a Left THA. Anterior Approach.   Clinical Impression  Pt is a 65 year old male s/p Left THR(Anterior approach)  who lives at home with her husband. Pt was independent in all ADLs prior to surgery and is eager to return to PLOF.  Pt is currently limited in functional ADLs due to pain, decreased ROM, and decreased functional mobility. Pt requires extensive assist for LE ADL skills due to pain and decreased AROM of the Left LE. Pt. would benefit from skilled OT services for education in A/E, functional mobility, and  Pt. education in recommendations for home modifications to increase safety and prevent falls.  Pt. plans to return home upon discharge.    Follow Up Recommendations  Home health OT    Equipment Recommendations       Recommendations for Other Services PT consult     Precautions / Restrictions Precautions Precautions: Fall;Anterior Hip Precaution Booklet Issued: Yes (comment) Restrictions Weight Bearing Restrictions: Yes LLE Weight Bearing: Weight bearing as tolerated                                   ADL Overall ADL's : Needs assistance/impaired Eating/Feeding: Set up   Grooming: Set up               Lower Body Dressing: Moderate assistance                 General ADL Comments: Pt. education was provided about A/E use for LE ADLs.     Vision     Perception     Praxis      Pertinent Vitals/Pain Pain Assessment: 0-10 Pain Score: 3  Pain Location: Left Hip Pain Descriptors / Indicators: Aching Pain Intervention(s): Premedicated before session;Monitored during session     Hand Dominance Left   Extremity/Trunk Assessment Upper Extremity Assessment Upper Extremity Assessment: Overall WFL for tasks assessed           Communication Communication Communication: No difficulties   Cognition Arousal/Alertness: Awake/alert Behavior During Therapy: WFL for tasks assessed/performed Overall Cognitive Status: Within Functional Limits for tasks assessed                     General Comments       Exercises   Other Exercises Other Exercises: Static standing balance training without UE support with feet apart, together, and semi-tandem with fair stability grossly   Shoulder Instructions      Home Living Family/patient expects to be discharged to:: Private residence Living Arrangements: Spouse/significant other Available Help at Discharge: Family Type of Home: House Home Access: Stairs to enter Technical brewer of Steps: 3 Entrance Stairs-Rails: Right;Left;Can reach both Home Layout: One level     Bathroom Shower/Tub: Tub/shower unit;Curtain Shower/tub characteristics: Architectural technologist: Standard Bathroom Accessibility: Yes   Home Equipment: Toilet riser;Bedside commode;Walker - 2 wheels;Cane - single point          Prior Functioning/Environment Level of Independence: Independent                 OT Problem List: Decreased strength;Impaired UE functional use;Decreased knowledge of use of DME or AE;Decreased activity tolerance;Pain   OT Treatment/Interventions: Self-care/ADL training;Therapeutic exercise;Patient/family education;Therapeutic activities;DME and/or AE instruction  OT Goals(Current goals can be found in the care plan section) Acute Rehab OT Goals Patient Stated Goal: Improved endurance OT Goal Formulation: With patient Potential to Achieve Goals: Good  OT Frequency: Min 1X/week   Barriers to D/C:            Co-evaluation              End of Session    Activity Tolerance: Patient tolerated treatment well Patient left: in chair;with call bell/phone within reach;with chair alarm set   Time: [id number] OT Time Calculation (min): 23  min Charges:  OT General Charges $OT Visit: 1 Procedure OT Evaluation $OT Eval Low Complexity: 1 Procedure $OT Eval Moderate Complexity: 1 Procedure OT Treatments $Self Care/Home Management : 8-22 mins G-Codes:    Harrel Carina, MS, OTR/L 02/11/2016, 11:40 AM

## 2016-02-11 NOTE — Care Management (Signed)
3 attempts made to evaluate patient. Will re attempt at a later time

## 2016-02-11 NOTE — Evaluation (Signed)
Physical Therapy Evaluation Patient Details Name: Jay [name] MRN: [id number] DOB: 08/07/1950 Today's Date: 02/11/2016   History of Present Illness  Pt. is a 65 y.o. male who was admitted for a Left THA. Anterior Approach.  Clinical Impression  Pt presents with deficits in strength, transfers, mobility, gait, balance, and activity tolerance.  Pt requires SBA and extra time and effort to complete bed mobility tasks, CGA with verbal cues for sequencing with transfers, and was able to amb with CGA and RW 30' with step-to pattern.  Pt will benefit from PT services to address above deficits for decreased caregiver assistance upon discharge.      Follow Up Recommendations Home health PT    Equipment Recommendations  Rolling walker with 5" wheels    Recommendations for Other Services       Precautions / Restrictions Precautions Precautions: Fall;Anterior Hip Precaution Booklet Issued: Yes (comment) Restrictions Weight Bearing Restrictions: Yes LLE Weight Bearing: Weight bearing as tolerated      Mobility  Bed Mobility Overal bed mobility: Needs Assistance Bed Mobility: Supine to Sit;Sit to Supine     Supine to sit: Supervision Sit to supine: Supervision      Transfers Overall transfer level: Needs assistance Equipment used: Rolling walker (2 wheeled) Transfers: Sit to/from Stand Sit to Stand: Min guard         General transfer comment: Mod verbal cues for sequencing  Ambulation/Gait Ambulation/Gait assistance: Min guard Ambulation Distance (Feet): 30 ft x 1, 10' x 1 Assistive device: Rolling walker (2 wheeled) Gait Pattern/deviations: Step-to pattern;Ataxic on L, gait training with mod verbal and visual cues for step-to sequencing secondary to L hip pain and weakness in standing.   Gait velocity interpretation: Below normal speed for age/gender General Gait Details: Mod verbal cues for step-to pattern secondary to L hip pain and LLE weakness in  standing.  Stairs Stairs:  (Deferred)          Wheelchair Mobility    Modified Rankin (Stroke Patients Only)       Balance Overall balance assessment: Needs assistance   Sitting balance-Leahy Scale: Good     Standing balance support: No upper extremity supported Standing balance-Leahy Scale: Fair                               Pertinent Vitals/Pain Pain Assessment: 0-10 Pain Score: 3  Pain Location: Left Hip Pain Descriptors / Indicators: Aching Pain Intervention(s): Premedicated before session;Monitored during session    [place] expects to be discharged to:: Private residence Living Arrangements: Spouse/significant other Available Help at Discharge: Family Type of Home: House Home Access: Stairs to enter Entrance Stairs-Rails: Right;Left;Can reach both Entrance Stairs-Number of Steps: 3 Home Layout: One level Home Equipment: Toilet riser;Bedside commode;Walker - 2 wheels;Cane - single point      Prior Function Level of Independence: Independent               Hand Dominance   Dominant Hand: Left    Extremity/Trunk Assessment   Upper Extremity Assessment: Overall WFL for tasks assessed           Lower Extremity Assessment: LLE deficits/detail   LLE Deficits / Details: General weakness to LLE with hip flex <3/5, knee ext 3+/5, knee flex 4/5, DF WFL     Communication   Communication: No difficulties  Cognition Arousal/Alertness: Awake/alert Behavior During Therapy: WFL for tasks assessed/performed Overall Cognitive Status: Within Functional Limits for tasks  assessed                      General Comments      Exercises Total Joint Exercises Ankle Circles/Pumps: AROM;Both;20 reps Quad Sets: AROM;Both;10 reps Gluteal Sets: AROM;Both;10 reps Straight Leg Raises: AAROM;Left;5 reps Long Arc Quad: AROM;Left;5 reps Knee Flexion: AROM;Left;5 reps Other Exercises Other Exercises: Static standing  balance training without UE support with feet apart, together, and semi-tandem with fair stability grossly   Assessment/Plan    PT Assessment Patient needs continued PT services  PT Problem List Decreased strength;Decreased activity tolerance;Decreased balance;Decreased mobility;Decreased knowledge of use of DME          PT Treatment Interventions DME instruction;Gait training;Stair training;Functional mobility training;Therapeutic activities;Therapeutic exercise;Balance training;Neuromuscular re-education;Patient/family education    PT Goals (Current goals can be found in the Care Plan section)  Acute Rehab PT Goals Patient Stated Goal: Improved endurance PT Goal Formulation: With patient Time For Goal Achievement: 02/25/16 Potential to Achieve Goals: Good    Frequency BID   Barriers to discharge        Co-evaluation               End of Session Equipment Utilized During Treatment: Gait belt Activity Tolerance: Patient limited by pain;Patient limited by fatigue Patient left: in chair;with chair alarm set;with call bell/phone within reach;with family/visitor present Nurse Communication: Mobility status         Time: [id number] PT Time Calculation (min) (ACUTE ONLY): 46 min   Charges:   PT Evaluation $PT Eval Low Complexity: 1 Procedure PT Treatments $Gait Training: 8-22 mins $Therapeutic Exercise: 8-22 mins   PT G Codes:        DRoyetta Asal PT, DPT 02/11/16, 11:41 AM

## 2016-02-11 NOTE — Care Management Note (Signed)
Case Management Note  Patient Details  Name: ALANO BLASCO MRN: [id number] Date of Birth: [birth year]  Subjective/Objective:  POD # 1 left THA. Patient lives at home with his wife. He is normally independent with adls. Has a walker, cane and BSC. Met with wife, Ascher Schroepfer and patient at bedside. Patient states he is on Eliquis. No Lovenox needed. Discussed home health and they have no agency preference. Referral to [facility] for [facility] PT.                 Action/Plan: HHPT with [facility].   Expected Discharge Date:                  Expected Discharge Plan:  [place]  In-House Referral:     Discharge planning Services  CM Consult  Post Acute Care Choice:  Home Health Choice offered to:  Patient, Spouse  DME Arranged:    DME Agency:     HH Arranged:  PT [place]:  [facility] (now [facility])  Status of Service:  In process, will continue to follow  If discussed at Long Length of Stay Meetings, dates discussed:    Additional Comments:  Jolly Mango, RN 02/11/2016, 3:27 PM

## 2016-02-11 NOTE — Progress Notes (Signed)
Physical Therapy Treatment Patient Details Name: Jay [name] MRN: [id number] DOB: 1950/04/13 Today's Date: 02/11/2016    History of Present Illness Pt. is a 65 y.o. male who was admitted for a Left THA. Anterior Approach.    PT Comments    Pt required slight increase with assist during bed mobility tasks from SBA to min A secondary to L hip pain.  Pt remains CGA with transfers with cues for proper sequencing.  Pt able to amb 95' with RW and CGA with step-to pattern.  Pt unable to attempt reciprocal gait pattern secondary to L hip pain.  Pt reported feeling a "catch" in his L hip x 2 during session that pt reports was common to feel prior to his THA that results in sharp pain in hip that resolves when seated.  Pt able to ascend/descend 1 step with CGA and B rails with cues for sequencing.  Pt will benefit from PT services to address deficits in functional mobility, strength, and balance for decreased caregiver assistance upon discharge.   Follow Up Recommendations  Home health PT     Equipment Recommendations  Rolling walker with 5" wheels    Recommendations for Other Services       Precautions / Restrictions Precautions Precautions: Fall;Anterior Hip Precaution Booklet Issued: Yes (comment) Restrictions Weight Bearing Restrictions: Yes LLE Weight Bearing: Weight bearing as tolerated    Mobility  Bed Mobility Overal bed mobility: Needs Assistance Bed Mobility: Supine to Sit;Sit to Supine     Supine to sit: Min assist Sit to supine: Min assist      Transfers Overall transfer level: Needs assistance Equipment used: Rolling walker (2 wheeled) Transfers: Sit to/from Stand Sit to Stand: Min guard         General transfer comment: Min verbal cues for sequencing  Ambulation/Gait Ambulation/Gait assistance: Min guard Ambulation Distance (Feet): 60 Feet Assistive device: Rolling walker (2 wheeled) Gait Pattern/deviations: Step-to pattern;Ataxic   Gait velocity  interpretation: Below normal speed for age/gender General Gait Details: Min verbal cues for step-to pattern secondary to L hip pain and LLE weakness with weight bearing   Stairs Stairs: Yes Stairs assistance: Min guard Stair Management: Two rails Number of Stairs: 1 General stair comments: Min verbal cues for sequencing with education provided to pt and spouse  Wheelchair Mobility    Modified Rankin (Stroke Patients Only)       Balance Overall balance assessment: Needs assistance Sitting-balance support: No upper extremity supported Sitting balance-Leahy Scale: Good     Standing balance support: No upper extremity supported Standing balance-Leahy Scale: Fair                      Cognition Arousal/Alertness: Awake/alert Behavior During Therapy: WFL for tasks assessed/performed Overall Cognitive Status: Within Functional Limits for tasks assessed                      Exercises Total Joint Exercises Ankle Circles/Pumps: AROM;Both;20 reps Quad Sets: AROM;Both;10 reps Gluteal Sets: AROM;Both;10 reps Straight Leg Raises: AAROM;Left;5 reps Long Arc Quad: AROM;Left;5 reps Knee Flexion: AROM;Left;5 reps Other Exercises Other Exercises: Static standing balance training without UE support with feet apart, together, and semi-tandem with combinations of eyes open/closed and head still/head turns with fair stability grossly with occasional min instability with pt able to self correct.    General Comments        Pertinent Vitals/Pain Pain Assessment: 0-10 Pain Score: 3  Pain Location: Left hip Pain Descriptors /  Indicators: Aching Pain Intervention(s): Monitored during session;Limited activity within patient's tolerance;Premedicated before session    [place] expects to be discharged to:: Private residence Living Arrangements: Spouse/significant other Available Help at Discharge: Family Type of Home: House Home Access: Stairs to  enter Entrance Stairs-Rails: Right;Left;Can reach both Home Layout: One level Home Equipment: Toilet riser;Bedside commode;Walker - 2 wheels;Cane - single point      Prior Function Level of Independence: Independent          PT Goals (current goals can now be found in the care plan section) Acute Rehab PT Goals Patient Stated Goal: Improved endurance PT Goal Formulation: With patient Time For Goal Achievement: 02/25/16 Potential to Achieve Goals: Good Progress towards PT goals: Progressing toward goals    Frequency    BID      PT Plan Current plan remains appropriate    Co-evaluation             End of Session Equipment Utilized During Treatment: Gait belt Activity Tolerance: Patient limited by pain;Patient limited by fatigue Patient left: in bed;with SCD's reapplied;with bed alarm set;with family/visitor present;with call bell/phone within reach     Time: 1330-1417 PT Time Calculation (min) (ACUTE ONLY): 47 min  Charges:  $Gait Training: 8-22 mins $Therapeutic Exercise: 8-22 mins $Therapeutic Activity: 8-22 mins                    G Codes:      DRoyetta Asal PT, DPT 02/11/16, 2:34 PM

## 2016-02-11 NOTE — Progress Notes (Signed)
   Subjective: 1 Day Post-Op Procedure(s) (LRB): TOTAL HIP ARTHROPLASTY ANTERIOR APPROACH (Left) Patient reports pain as mild.   Patient is well, and has had no acute complaints or problems Denies any CP, SOB, ABD pain. We will continue therapy today.  Plan is to go Home after hospital stay.  Objective: Vital signs in last 24 hours: Temp:  [97.3 F (36.3 C)-98.9 F (37.2 C)] 98.9 F (37.2 C) (11/01 0750) Pulse Rate:  [67-79] 73 (11/01 0750) Resp:  [0-21] 16 (11/01 0750) BP: (115-165)/(63-105) 115/63 (11/01 0750) SpO2:  [93 %-100 %] 93 % (11/01 0750)  Intake/Output from previous day: 10/31 0701 - 11/01 0700 In: 2481.3 [P.O.:960; I.V.:1321.3; IV Piggyback:200] Out: 4300 [Urine:4100; Blood:200] Intake/Output this shift: No intake/output data recorded.   Recent Labs  02/10/16 1733 02/11/16 0443  HGB 13.8 13.2    Recent Labs  02/10/16 1733 02/11/16 0443  WBC 14.2* 9.8  RBC 4.31* 4.01*  HCT 40.6 37.8*  PLT 186 175    Recent Labs  02/10/16 1733 02/11/16 0443  NA  --  138  K  --  4.1  CL  --  104  CO2  --  29  BUN  --  9  CREATININE 0.83 0.95  GLUCOSE  --  149*  CALCIUM  --  8.5*   No results for input(s): LABPT, INR in the last 72 hours.  EXAM General - Patient is Alert, Appropriate and Oriented Extremity - Neurovascular intact Sensation intact distally Intact pulses distally Dorsiflexion/Plantar flexion intact No cellulitis present Compartment soft Dressing - dressing C/D/I and no drainage Motor Function - intact, moving foot and toes well on exam.   Past Medical History:  Diagnosis Date  . AICD (automatic cardioverter/defibrillator) present    05/08/15  . Cardiomyopathy ([place])    NONISCHEMIC DILATED  . CHF (congestive heart failure) (HCC)    CHRONIC  . Complication of anesthesia    woke up doing surgeries  . Coronary artery disease   . Dyspnea    CHRONIC DOE  . Dysrhythmia    SUSTAINED VT/BRADYCARDIA/PAROXYSMAL A FIB  . Hypertension   .  MI (myocardial infarction)   . Presence of permanent cardiac pacemaker   . Sleep apnea    no cpap    Assessment/Plan:   1 Day Post-Op Procedure(s) (LRB): TOTAL HIP ARTHROPLASTY ANTERIOR APPROACH (Left) Active Problems:   Primary localized osteoarthritis of left hip  Estimated body mass index is 27.09 kg/m as calculated from the following:   Height as of 01/28/16: 6\' 2"  (1.88 m).   Weight as of 01/28/16: 95.7 kg (211 lb). Advance diet Up with therapy  Needs BM Recheck labs in the am   DVT Prophylaxis - Foot Pumps, TED hose and Eliquis Weight-Bearing as tolerated to left leg   T. Rachelle Hora, PA-C [place] 02/11/2016, 8:21 AM

## 2016-02-11 NOTE — Anesthesia Postprocedure Evaluation (Signed)
Anesthesia Post Note  Patient: Jay [name]  Procedure(s) Performed: Procedure(s) (LRB): TOTAL HIP ARTHROPLASTY ANTERIOR APPROACH (Left)  Patient location during evaluation: Nursing Unit Anesthesia Type: Spinal Level of consciousness: awake, awake and alert and oriented Pain management: pain level controlled Vital Signs Assessment: post-procedure vital signs reviewed and stable Respiratory status: spontaneous breathing, nonlabored ventilation and respiratory function stable Cardiovascular status: stable Postop Assessment: no headache and no backache Anesthetic complications: no    Last Vitals:  Vitals:   02/11/16 0404 02/11/16 0750  BP: 128/82 115/63  Pulse: 72 73  Resp: 16 16  Temp: 37.2 C 37.2 C    Last Pain:  Vitals:   02/11/16 0750  TempSrc: Oral  PainSc:                  [name]

## 2016-02-12 LAB — BASIC METABOLIC PANEL
Anion gap: 7 (ref 5–15)
BUN: 14 mg/dL (ref 6–20)
CALCIUM: 8.5 mg/dL — AB (ref 8.9–10.3)
CO2: 26 mmol/L (ref 22–32)
CREATININE: 0.93 mg/dL (ref 0.61–1.24)
Chloride: 103 mmol/L (ref 101–111)
GFR calc Af Amer: >60 mL/min (ref >60)
GFR calc non Af Amer: >60 mL/min (ref >60)
GLUCOSE: 127 mg/dL — AB (ref 65–99)
Potassium: 3.6 mmol/L (ref 3.5–5.1)
Sodium: 136 mmol/L (ref 135–145)

## 2016-02-12 LAB — CBC
HEMATOCRIT: 35.1 % — AB (ref 40.0–52.0)
Hemoglobin: 12.4 g/dL — ABNORMAL LOW (ref 13.0–18.0)
MCH: 32.9 pg (ref 26.0–34.0)
MCHC: 35.4 g/dL (ref 32.0–36.0)
MCV: 92.9 fL (ref 80.0–100.0)
Platelets: 148 10*3/uL — ABNORMAL LOW (ref 150–440)
RBC: 3.77 MIL/uL — ABNORMAL LOW (ref 4.40–5.90)
RDW: 13 % (ref 11.5–14.5)
WBC: 11.5 10*3/uL — ABNORMAL HIGH (ref 3.8–10.6)

## 2016-02-12 LAB — SURGICAL PATHOLOGY

## 2016-02-12 MED ORDER — OXYCODONE HCL 5 MG PO TABS: 5.0000 mg | ORAL_TABLET | ORAL | 0 refills | Status: DC | PRN

## 2016-02-12 NOTE — Progress Notes (Signed)
Pt being discharged home today. PIV removed. Discharge instructions reviewed with pt, all questions answered. Prescriptions given to pt to have filled. Follow up appointment has been scheduled. HHPT has been set up through [facility]. He is leaving with all his belongings, will be transported home via family.

## 2016-02-12 NOTE — Progress Notes (Signed)
   Subjective: 2 Days Post-Op Procedure(s) (LRB): TOTAL HIP ARTHROPLASTY ANTERIOR APPROACH (Left) Patient reports pain as 3 on 0-10 scale.   Patient is well, and has had no acute complaints or problems Denies any CP, SOB, ABD pain. We will continue therapy today.  Plan is to go Home after hospital stay.  Objective: Vital signs in last 24 hours: Temp:  [98.1 F (36.7 C)-99.2 F (37.3 C)] 98.1 F (36.7 C) (11/02 0734) Pulse Rate:  [43-82] 75 (11/02 0734) Resp:  [16-19] 19 (11/02 0734) BP: (92-136)/(58-85) 92/62 (11/02 0734) SpO2:  [94 %-97 %] 97 % (11/02 0734) Weight:  [95.3 kg (210 lb)] 95.3 kg (210 lb) (11/01 1332)  Intake/Output from previous day: 11/01 0701 - 11/02 0700 In: 720 [P.O.:720] Out: 1825 [Urine:1825] Intake/Output this shift: Total I/O In: -  Out: 400 [Urine:400]   Recent Labs  02/10/16 1733 02/11/16 0443 02/12/16 0446  HGB 13.8 13.2 12.4*    Recent Labs  02/11/16 0443 02/12/16 0446  WBC 9.8 11.5*  RBC 4.01* 3.77*  HCT 37.8* 35.1*  PLT 175 148*    Recent Labs  02/11/16 0443 02/12/16 0446  NA 138 136  K 4.1 3.6  CL 104 103  CO2 29 26  BUN 9 14  CREATININE 0.95 0.93  GLUCOSE 149* 127*  CALCIUM 8.5* 8.5*   No results for input(s): LABPT, INR in the last 72 hours.  EXAM General - Patient is Alert, Appropriate and Oriented Extremity - Neurovascular intact Sensation intact distally Intact pulses distally Dorsiflexion/Plantar flexion intact No cellulitis present Compartment soft Dressing - dressing C/D/I and scant drainage Motor Function - intact, moving foot and toes well on exam.   Past Medical History:  Diagnosis Date  . AICD (automatic cardioverter/defibrillator) present    05/08/15  . Cardiomyopathy ([place])    NONISCHEMIC DILATED  . CHF (congestive heart failure) (HCC)    CHRONIC  . Complication of anesthesia    woke up doing surgeries  . Coronary artery disease   . Dyspnea    CHRONIC DOE  . Dysrhythmia    SUSTAINED  VT/BRADYCARDIA/PAROXYSMAL A FIB  . Hypertension   . MI (myocardial infarction)   . Presence of permanent cardiac pacemaker   . Sleep apnea    no cpap    Assessment/Plan:   2 Days Post-Op Procedure(s) (LRB): TOTAL HIP ARTHROPLASTY ANTERIOR APPROACH (Left) Active Problems:   Primary localized osteoarthritis of left hip  Estimated body mass index is 26.96 kg/m as calculated from the following:   Height as of this encounter: 6\' 2"  (1.88 m).   Weight as of this encounter: 95.3 kg (210 lb). Advance diet Up with therapy  Acute post op blood loss anemia - Hgb stable 12.4 Needs BM Plan on discharge to home with HHPT pending progress with PT ad BM    DVT Prophylaxis - Foot Pumps, TED hose and Eliquis Weight-Bearing as tolerated to left leg   T. Rachelle Hora, PA-C [place] 02/12/2016, 8:15 AM

## 2016-02-12 NOTE — Discharge Instructions (Signed)

## 2016-02-12 NOTE — Discharge Summary (Signed)
Physician Discharge Summary  Patient ID: Jay [name] MRN: [id number] DOB/AGE: [birth year] 65 y.o.  Admit date: 02/10/2016 Discharge date: 02/12/2016  Admission Diagnoses:  PRIMARY OSTEOARTHRITIS   Discharge Diagnoses: Patient Active Problem List   Diagnosis Date Noted  . Primary localized osteoarthritis of left hip 02/10/2016  . V-tach ([place]) 05/04/2015  . CAD (coronary artery disease) 05/04/2015  . HTN (hypertension) 05/04/2015  . Chronic systolic CHF (congestive heart failure) ([place]) 05/04/2015  . Nonischemic dilated cardiomyopathy ([place]) 05/04/2015    Past Medical History:  Diagnosis Date  . AICD (automatic cardioverter/defibrillator) present    05/08/15  . Cardiomyopathy ([place])    NONISCHEMIC DILATED  . CHF (congestive heart failure) (HCC)    CHRONIC  . Complication of anesthesia    woke up doing surgeries  . Coronary artery disease   . Dyspnea    CHRONIC DOE  . Dysrhythmia    SUSTAINED VT/BRADYCARDIA/PAROXYSMAL A FIB  . Hypertension   . MI (myocardial infarction)   . Presence of permanent cardiac pacemaker   . Sleep apnea    no cpap     Transfusion: none   Consultants (if any):   Discharged Condition: Improved  Hospital Course: BURTON DANZIGER is an 65 y.o. male who was admitted 02/10/2016 with a diagnosis of Left hip osteoarthritis and went to the operating room on 02/10/2016 and underwent the above named procedures.    Surgeries: Procedure(s): TOTAL HIP ARTHROPLASTY ANTERIOR APPROACH on 02/10/2016 Patient tolerated the surgery well. Taken to PACU where she was stabilized and then transferred to the orthopedic floor.  Started on Eliquis. Foot pumps applied bilaterally at 80 mm. Heels elevated on bed with rolled towels. No evidence of DVT. Negative [place]. Physical therapy started on day #1 for gait training and transfer. OT started day #1 for ADL and assisted devices.  Patient's foley was d/c on day #1. Patient's IV was d/c on day #2.  On post op day  #2 patient was stable and ready for discharge to home with HHPT.  Implants:  Medacta AMIS for standard stem with 60 mm Mpact cup DM with liner and L 28 mm head  He was given perioperative antibiotics:  Anti-infectives    Start     Dose/Rate Route Frequency Ordered Stop   02/10/16 1900  ceFAZolin (ANCEF) IVPB 2g/100 mL premix     2 g 200 mL/hr over 30 Minutes Intravenous Every 6 hours 02/10/16 1713 02/11/16 0632   02/10/16 1025  ceFAZolin (ANCEF) 2-4 GM/100ML-% IVPB    Comments:  Idamae Lusher: cabinet override      02/10/16 1025 02/10/16 1250   02/10/16 0115  ceFAZolin (ANCEF) IVPB 2g/100 mL premix     2 g 200 mL/hr over 30 Minutes Intravenous  Once 02/10/16 0109 02/10/16 1305    .  He was given sequential compression devices, early ambulation, and Eliquis for DVT prophylaxis.  He benefited maximally from the hospital stay and there were no complications.    Recent vital signs:  Vitals:   02/12/16 0332 02/12/16 0734  BP: 121/64 92/62  Pulse: 72 75  Resp: 18 19  Temp: 98.9 F (37.2 C) 98.1 F (36.7 C)    Recent laboratory studies:  Lab Results  Component Value Date   HGB 12.4 (L) 02/12/2016   HGB 13.2 02/11/2016   HGB 13.8 02/10/2016   Lab Results  Component Value Date   WBC 11.5 (H) 02/12/2016   PLT 148 (L) 02/12/2016   Lab Results  Component Value Date  INR 1.01 01/28/2016   Lab Results  Component Value Date   NA 136 02/12/2016   K 3.6 02/12/2016   CL 103 02/12/2016   CO2 26 02/12/2016   BUN 14 02/12/2016   CREATININE 0.93 02/12/2016   GLUCOSE 127 (H) 02/12/2016    Discharge Medications:     Medication List    TAKE these medications   CENTRUM SILVER ULTRA MENS Tabs Take 1 tablet by mouth every morning.   ELIQUIS 5 MG Tabs tablet Generic drug:  apixaban Take 5 mg by mouth 2 (two) times daily.   fluticasone 50 MCG/ACT nasal spray Commonly known as:  FLONASE Place 2 sprays into the nose daily as needed for allergies.   lisinopril 5 MG  tablet Commonly known as:  PRINIVIL,ZESTRIL Take 5 mg by mouth every morning.   lovastatin 40 MG tablet Commonly known as:  MEVACOR Take 40 mg by mouth daily after supper.   oxyCODONE 5 MG immediate release tablet Commonly known as:  Oxy IR/ROXICODONE Take 1-2 tablets (5-10 mg total) by mouth every 3 (three) hours as needed for breakthrough pain.   RA VITAMIN B-12 TR 1000 MCG Tbcr Generic drug:  Cyanocobalamin Take 1,000 mcg by mouth every morning.   sotalol 80 MG tablet Commonly known as:  BETAPACE Take 80 mg by mouth 2 (two) times daily.   vitamin C 500 MG tablet Commonly known as:  ASCORBIC ACID Take 500 mg by mouth daily.   Vitamin D 2000 units tablet Take 2,000 Units by mouth daily.   vitamin E 400 UNIT capsule Take 400 Units by mouth every morning.       Diagnostic Studies: Dg Hip Operative Unilat W Or W/o Pelvis Left  Result Date: 02/10/2016 CLINICAL DATA:  Left hip replacement EXAM: OPERATIVE LEFT HIP (WITH PELVIS IF PERFORMED) 2 VIEWS TECHNIQUE: Fluoroscopic spot image(s) were submitted for interpretation post-operatively. COMPARISON:  None. FINDINGS: Two intraoperative spot images demonstrate left hip replacement. Normal AP alignment. No visible complicating feature. IMPRESSION: Left hip replacement without visible complicating feature. Electronically Signed   By: Rolm Baptise M.D.   On: 02/10/2016 14:00   Dg Hip Unilat W Or W/o Pelvis 2-3 Views Left  Result Date: 02/10/2016 CLINICAL DATA:  Left total hip replacement. EXAM: DG HIP (WITH OR WITHOUT PELVIS) 2-3V LEFT COMPARISON:  None. FINDINGS: Bipolar hip replacement on the left. Components are well positioned. No radiographically detectable complication. IMPRESSION: Good appearance following bipolar hip replacement on the left. Electronically Signed   By: Nelson Chimes M.D.   On: 02/10/2016 15:21    Disposition: 02-Transferred to [place], MD .    Specialty:  Orthopedic Surgery Why:  2 weeks for staple removal Contact information: [address] [place] [address] [phone]            Signed: Feliberto Gottron 02/12/2016, 3:26 PM

## 2016-02-12 NOTE — Progress Notes (Signed)
Physical Therapy Treatment Patient Details Name: Jay [name] MRN: [id number] DOB: [birth year] Today's Date: 02/12/2016    History of Present Illness Pt. is a 65 y.o. male who was admitted for a Left THA. Anterior Approach.    PT Comments    Pt again shows great effort and was able to circumambulate the nurses' station, but continues to be slow, hesitant and highly reliant on UEs/AD to maintain balance and manage WBing tolerance.  He did show improved cadence and confidence with much cuing/encouragment/education but still is struggling to take a lot of weight through L LE.  Pt with very weak hip flexion and generally with LE strength but showed great effort and will be safe at home.   Follow Up Recommendations  Home health PT     Equipment Recommendations  Rolling walker with 5" wheels    Recommendations for Other Services       Precautions / Restrictions Precautions Precautions: Fall;Anterior Hip Restrictions Weight Bearing Restrictions: Yes LLE Weight Bearing: Weight bearing as tolerated    Mobility  Bed Mobility               General bed mobility comments: Pt in recliner on arrival, not tested  Transfers Overall transfer level: Modified independent Equipment used: Rolling walker (2 wheeled) Transfers: Sit to/from Stand Sit to Stand: Min guard         General transfer comment: Pt with heavy UE reliance, but does not need assist to rise - minimal cuing for set up/positioning.  Safe but slow, deliberate, effortful gait.  Ambulation/Gait Ambulation/Gait assistance: Min guard Ambulation Distance (Feet): 200 Feet Assistive device: Rolling walker (2 wheeled)       General Gait Details: Pt continues to be very reliant on the walker as he is still unable to tolerate a lot of weight on the L LE.  After some very stiff/slow/guarded intial steps he was able to increase cadence (step through) improve heel down/WBing but still had a lot of relaince on walker and  hesitancy.   Stairs            Wheelchair Mobility    Modified Rankin (Stroke Patients Only)       Balance Overall balance assessment: Modified Independent           Standing balance-Leahy Scale: Fair (Pt continues to be very reliant on UEs/AD in standing)                      Cognition Arousal/Alertness: Awake/alert Behavior During Therapy: WFL for tasks assessed/performed Overall Cognitive Status: Within Functional Limits for tasks assessed                      Exercises Total Joint Exercises Ankle Circles/Pumps: Strengthening;10 reps Heel Slides: Strengthening;AROM;10 reps Long Arc Quad: AROM;Strengthening;10 reps Knee Flexion: AROM;Strengthening;10 reps Marching in Standing: Seated;AAROM;10 reps    General Comments        Pertinent Vitals/Pain Pain Assessment: 0-10 Pain Score: 4  (increases significantly with exercises and WBing) Pain Location: L thigh, groin, glute     Home Living                      Prior Function            PT Goals (current goals can now be found in the care plan section) Progress towards PT goals: Progressing toward goals    Frequency    BID      PT  Plan Current plan remains appropriate    Co-evaluation             End of Session Equipment Utilized During Treatment: Gait belt Activity Tolerance: Patient limited by pain;Patient tolerated treatment well Patient left: with chair alarm set;with call bell/phone within reach;with family/visitor present     Time: [id number] PT Time Calculation (min) (ACUTE ONLY): 43 min  Charges:  $Gait Training: 23-37 mins $Therapeutic Exercise: 8-22 mins                    G Codes:      Kreg Shropshire, DPT 02/12/2016, 12:21 PM

## 2016-02-18 DIAGNOSIS — I429 Cardiomyopathy, unspecified: Secondary | ICD-10-CM | POA: Diagnosis not present

## 2016-02-18 DIAGNOSIS — I110 Hypertensive heart disease with heart failure: Secondary | ICD-10-CM | POA: Diagnosis not present

## 2016-02-18 DIAGNOSIS — I480 Paroxysmal atrial fibrillation: Secondary | ICD-10-CM | POA: Diagnosis not present

## 2016-02-18 DIAGNOSIS — I2510 Atherosclerotic heart disease of native coronary artery without angina pectoris: Secondary | ICD-10-CM | POA: Diagnosis not present

## 2016-02-18 DIAGNOSIS — Z471 Aftercare following joint replacement surgery: Secondary | ICD-10-CM | POA: Diagnosis not present

## 2016-02-18 DIAGNOSIS — I5022 Chronic systolic (congestive) heart failure: Secondary | ICD-10-CM | POA: Diagnosis not present

## 2016-02-19 DIAGNOSIS — I2510 Atherosclerotic heart disease of native coronary artery without angina pectoris: Secondary | ICD-10-CM | POA: Diagnosis not present

## 2016-02-19 DIAGNOSIS — I480 Paroxysmal atrial fibrillation: Secondary | ICD-10-CM | POA: Diagnosis not present

## 2016-02-19 DIAGNOSIS — I110 Hypertensive heart disease with heart failure: Secondary | ICD-10-CM | POA: Diagnosis not present

## 2016-02-19 DIAGNOSIS — Z471 Aftercare following joint replacement surgery: Secondary | ICD-10-CM | POA: Diagnosis not present

## 2016-02-19 DIAGNOSIS — I5022 Chronic systolic (congestive) heart failure: Secondary | ICD-10-CM | POA: Diagnosis not present

## 2016-02-19 DIAGNOSIS — I429 Cardiomyopathy, unspecified: Secondary | ICD-10-CM | POA: Diagnosis not present

## 2016-02-20 DIAGNOSIS — I2510 Atherosclerotic heart disease of native coronary artery without angina pectoris: Secondary | ICD-10-CM | POA: Diagnosis not present

## 2016-02-20 DIAGNOSIS — I429 Cardiomyopathy, unspecified: Secondary | ICD-10-CM | POA: Diagnosis not present

## 2016-02-20 DIAGNOSIS — I5022 Chronic systolic (congestive) heart failure: Secondary | ICD-10-CM | POA: Diagnosis not present

## 2016-02-20 DIAGNOSIS — I480 Paroxysmal atrial fibrillation: Secondary | ICD-10-CM | POA: Diagnosis not present

## 2016-02-20 DIAGNOSIS — Z471 Aftercare following joint replacement surgery: Secondary | ICD-10-CM | POA: Diagnosis not present

## 2016-02-20 DIAGNOSIS — I110 Hypertensive heart disease with heart failure: Secondary | ICD-10-CM | POA: Diagnosis not present

## 2016-02-22 DIAGNOSIS — I429 Cardiomyopathy, unspecified: Secondary | ICD-10-CM | POA: Diagnosis not present

## 2016-02-22 DIAGNOSIS — I480 Paroxysmal atrial fibrillation: Secondary | ICD-10-CM | POA: Diagnosis not present

## 2016-02-22 DIAGNOSIS — Z471 Aftercare following joint replacement surgery: Secondary | ICD-10-CM | POA: Diagnosis not present

## 2016-02-22 DIAGNOSIS — I5022 Chronic systolic (congestive) heart failure: Secondary | ICD-10-CM | POA: Diagnosis not present

## 2016-02-22 DIAGNOSIS — I110 Hypertensive heart disease with heart failure: Secondary | ICD-10-CM | POA: Diagnosis not present

## 2016-02-22 DIAGNOSIS — I2510 Atherosclerotic heart disease of native coronary artery without angina pectoris: Secondary | ICD-10-CM | POA: Diagnosis not present

## 2016-02-23 DIAGNOSIS — Z7901 Long term (current) use of anticoagulants: Secondary | ICD-10-CM | POA: Diagnosis not present

## 2016-02-23 DIAGNOSIS — I480 Paroxysmal atrial fibrillation: Secondary | ICD-10-CM | POA: Diagnosis not present

## 2016-02-23 DIAGNOSIS — Z95810 Presence of automatic (implantable) cardiac defibrillator: Secondary | ICD-10-CM | POA: Diagnosis not present

## 2016-02-23 DIAGNOSIS — I509 Heart failure, unspecified: Secondary | ICD-10-CM | POA: Diagnosis not present

## 2016-02-23 DIAGNOSIS — E785 Hyperlipidemia, unspecified: Secondary | ICD-10-CM | POA: Diagnosis not present

## 2016-02-23 DIAGNOSIS — Z79899 Other long term (current) drug therapy: Secondary | ICD-10-CM | POA: Diagnosis not present

## 2016-02-23 DIAGNOSIS — I493 Ventricular premature depolarization: Secondary | ICD-10-CM | POA: Diagnosis not present

## 2016-02-23 DIAGNOSIS — I110 Hypertensive heart disease with heart failure: Secondary | ICD-10-CM | POA: Diagnosis not present

## 2016-02-25 DIAGNOSIS — I480 Paroxysmal atrial fibrillation: Secondary | ICD-10-CM | POA: Diagnosis not present

## 2016-02-25 DIAGNOSIS — I2510 Atherosclerotic heart disease of native coronary artery without angina pectoris: Secondary | ICD-10-CM | POA: Diagnosis not present

## 2016-02-25 DIAGNOSIS — I110 Hypertensive heart disease with heart failure: Secondary | ICD-10-CM | POA: Diagnosis not present

## 2016-02-25 DIAGNOSIS — Z471 Aftercare following joint replacement surgery: Secondary | ICD-10-CM | POA: Diagnosis not present

## 2016-02-25 DIAGNOSIS — I5022 Chronic systolic (congestive) heart failure: Secondary | ICD-10-CM | POA: Diagnosis not present

## 2016-02-25 DIAGNOSIS — I429 Cardiomyopathy, unspecified: Secondary | ICD-10-CM | POA: Diagnosis not present

## 2016-02-27 DIAGNOSIS — I2510 Atherosclerotic heart disease of native coronary artery without angina pectoris: Secondary | ICD-10-CM | POA: Diagnosis not present

## 2016-02-27 DIAGNOSIS — I429 Cardiomyopathy, unspecified: Secondary | ICD-10-CM | POA: Diagnosis not present

## 2016-02-27 DIAGNOSIS — I5022 Chronic systolic (congestive) heart failure: Secondary | ICD-10-CM | POA: Diagnosis not present

## 2016-02-27 DIAGNOSIS — I480 Paroxysmal atrial fibrillation: Secondary | ICD-10-CM | POA: Diagnosis not present

## 2016-02-27 DIAGNOSIS — Z471 Aftercare following joint replacement surgery: Secondary | ICD-10-CM | POA: Diagnosis not present

## 2016-02-27 DIAGNOSIS — I110 Hypertensive heart disease with heart failure: Secondary | ICD-10-CM | POA: Diagnosis not present

## 2016-03-01 DIAGNOSIS — I429 Cardiomyopathy, unspecified: Secondary | ICD-10-CM | POA: Diagnosis not present

## 2016-03-01 DIAGNOSIS — Z471 Aftercare following joint replacement surgery: Secondary | ICD-10-CM | POA: Diagnosis not present

## 2016-03-01 DIAGNOSIS — I2510 Atherosclerotic heart disease of native coronary artery without angina pectoris: Secondary | ICD-10-CM | POA: Diagnosis not present

## 2016-03-01 DIAGNOSIS — I480 Paroxysmal atrial fibrillation: Secondary | ICD-10-CM | POA: Diagnosis not present

## 2016-03-01 DIAGNOSIS — I110 Hypertensive heart disease with heart failure: Secondary | ICD-10-CM | POA: Diagnosis not present

## 2016-03-01 DIAGNOSIS — I5022 Chronic systolic (congestive) heart failure: Secondary | ICD-10-CM | POA: Diagnosis not present

## 2016-03-03 DIAGNOSIS — I2510 Atherosclerotic heart disease of native coronary artery without angina pectoris: Secondary | ICD-10-CM | POA: Diagnosis not present

## 2016-03-03 DIAGNOSIS — I5022 Chronic systolic (congestive) heart failure: Secondary | ICD-10-CM | POA: Diagnosis not present

## 2016-03-03 DIAGNOSIS — I110 Hypertensive heart disease with heart failure: Secondary | ICD-10-CM | POA: Diagnosis not present

## 2016-03-03 DIAGNOSIS — Z471 Aftercare following joint replacement surgery: Secondary | ICD-10-CM | POA: Diagnosis not present

## 2016-03-03 DIAGNOSIS — I429 Cardiomyopathy, unspecified: Secondary | ICD-10-CM | POA: Diagnosis not present

## 2016-03-03 DIAGNOSIS — I480 Paroxysmal atrial fibrillation: Secondary | ICD-10-CM | POA: Diagnosis not present

## 2016-03-05 DIAGNOSIS — I429 Cardiomyopathy, unspecified: Secondary | ICD-10-CM | POA: Diagnosis not present

## 2016-03-05 DIAGNOSIS — I5022 Chronic systolic (congestive) heart failure: Secondary | ICD-10-CM | POA: Diagnosis not present

## 2016-03-05 DIAGNOSIS — I480 Paroxysmal atrial fibrillation: Secondary | ICD-10-CM | POA: Diagnosis not present

## 2016-03-05 DIAGNOSIS — I110 Hypertensive heart disease with heart failure: Secondary | ICD-10-CM | POA: Diagnosis not present

## 2016-03-05 DIAGNOSIS — I2510 Atherosclerotic heart disease of native coronary artery without angina pectoris: Secondary | ICD-10-CM | POA: Diagnosis not present

## 2016-03-05 DIAGNOSIS — Z471 Aftercare following joint replacement surgery: Secondary | ICD-10-CM | POA: Diagnosis not present

## 2016-03-22 DIAGNOSIS — Z96642 Presence of left artificial hip joint: Secondary | ICD-10-CM | POA: Diagnosis not present

## 2016-06-25 DIAGNOSIS — I10 Essential (primary) hypertension: Secondary | ICD-10-CM | POA: Diagnosis not present

## 2016-06-25 DIAGNOSIS — E782 Mixed hyperlipidemia: Secondary | ICD-10-CM | POA: Diagnosis not present

## 2016-06-25 DIAGNOSIS — R7303 Prediabetes: Secondary | ICD-10-CM | POA: Diagnosis not present

## 2016-07-02 DIAGNOSIS — D638 Anemia in other chronic diseases classified elsewhere: Secondary | ICD-10-CM | POA: Diagnosis not present

## 2016-07-02 DIAGNOSIS — I10 Essential (primary) hypertension: Secondary | ICD-10-CM | POA: Diagnosis not present

## 2016-07-02 DIAGNOSIS — R7303 Prediabetes: Secondary | ICD-10-CM | POA: Diagnosis not present

## 2016-07-02 DIAGNOSIS — E782 Mixed hyperlipidemia: Secondary | ICD-10-CM | POA: Diagnosis not present

## 2016-07-04 DIAGNOSIS — Z4502 Encounter for adjustment and management of automatic implantable cardiac defibrillator: Secondary | ICD-10-CM | POA: Diagnosis not present

## 2016-07-04 DIAGNOSIS — I493 Ventricular premature depolarization: Secondary | ICD-10-CM | POA: Diagnosis not present

## 2016-07-04 DIAGNOSIS — I4891 Unspecified atrial fibrillation: Secondary | ICD-10-CM | POA: Diagnosis not present

## 2016-07-26 DIAGNOSIS — E782 Mixed hyperlipidemia: Secondary | ICD-10-CM | POA: Diagnosis not present

## 2016-07-26 DIAGNOSIS — Z01810 Encounter for preprocedural cardiovascular examination: Secondary | ICD-10-CM | POA: Diagnosis not present

## 2016-07-26 DIAGNOSIS — I10 Essential (primary) hypertension: Secondary | ICD-10-CM | POA: Diagnosis not present

## 2016-07-26 DIAGNOSIS — Z95810 Presence of automatic (implantable) cardiac defibrillator: Secondary | ICD-10-CM | POA: Diagnosis not present

## 2016-07-26 DIAGNOSIS — I480 Paroxysmal atrial fibrillation: Secondary | ICD-10-CM | POA: Diagnosis not present

## 2016-07-26 DIAGNOSIS — I493 Ventricular premature depolarization: Secondary | ICD-10-CM | POA: Diagnosis not present

## 2016-07-26 DIAGNOSIS — I252 Old myocardial infarction: Secondary | ICD-10-CM | POA: Diagnosis not present

## 2016-07-26 DIAGNOSIS — I519 Heart disease, unspecified: Secondary | ICD-10-CM | POA: Diagnosis not present

## 2016-07-26 DIAGNOSIS — I472 Ventricular tachycardia: Secondary | ICD-10-CM | POA: Diagnosis not present

## 2016-08-06 DIAGNOSIS — J3089 Other allergic rhinitis: Secondary | ICD-10-CM | POA: Diagnosis not present

## 2016-08-11 DIAGNOSIS — I493 Ventricular premature depolarization: Secondary | ICD-10-CM | POA: Diagnosis not present

## 2016-08-11 DIAGNOSIS — I429 Cardiomyopathy, unspecified: Secondary | ICD-10-CM | POA: Diagnosis not present

## 2016-08-11 DIAGNOSIS — I509 Heart failure, unspecified: Secondary | ICD-10-CM | POA: Diagnosis not present

## 2016-08-11 DIAGNOSIS — Z95810 Presence of automatic (implantable) cardiac defibrillator: Secondary | ICD-10-CM | POA: Diagnosis not present

## 2016-08-11 DIAGNOSIS — F17220 Nicotine dependence, chewing tobacco, uncomplicated: Secondary | ICD-10-CM | POA: Diagnosis not present

## 2016-08-11 DIAGNOSIS — R931 Abnormal findings on diagnostic imaging of heart and coronary circulation: Secondary | ICD-10-CM | POA: Diagnosis not present

## 2016-08-11 DIAGNOSIS — I480 Paroxysmal atrial fibrillation: Secondary | ICD-10-CM | POA: Diagnosis not present

## 2016-08-11 DIAGNOSIS — E785 Hyperlipidemia, unspecified: Secondary | ICD-10-CM | POA: Diagnosis not present

## 2016-08-11 DIAGNOSIS — I110 Hypertensive heart disease with heart failure: Secondary | ICD-10-CM | POA: Diagnosis not present

## 2016-08-11 DIAGNOSIS — Z7901 Long term (current) use of anticoagulants: Secondary | ICD-10-CM | POA: Diagnosis not present

## 2016-08-11 DIAGNOSIS — Z4502 Encounter for adjustment and management of automatic implantable cardiac defibrillator: Secondary | ICD-10-CM | POA: Diagnosis not present

## 2016-08-11 DIAGNOSIS — I472 Ventricular tachycardia: Secondary | ICD-10-CM | POA: Diagnosis not present

## 2016-08-30 DIAGNOSIS — R05 Cough: Secondary | ICD-10-CM | POA: Diagnosis not present

## 2016-11-25 DIAGNOSIS — Z4502 Encounter for adjustment and management of automatic implantable cardiac defibrillator: Secondary | ICD-10-CM | POA: Diagnosis not present

## 2017-01-06 DIAGNOSIS — D638 Anemia in other chronic diseases classified elsewhere: Secondary | ICD-10-CM | POA: Diagnosis not present

## 2017-01-06 DIAGNOSIS — E782 Mixed hyperlipidemia: Secondary | ICD-10-CM | POA: Diagnosis not present

## 2017-01-06 DIAGNOSIS — R7303 Prediabetes: Secondary | ICD-10-CM | POA: Diagnosis not present

## 2017-01-06 DIAGNOSIS — I10 Essential (primary) hypertension: Secondary | ICD-10-CM | POA: Diagnosis not present

## 2017-01-14 DIAGNOSIS — R7303 Prediabetes: Secondary | ICD-10-CM | POA: Diagnosis not present

## 2017-01-14 DIAGNOSIS — E782 Mixed hyperlipidemia: Secondary | ICD-10-CM | POA: Diagnosis not present

## 2017-01-14 DIAGNOSIS — I10 Essential (primary) hypertension: Secondary | ICD-10-CM | POA: Diagnosis not present

## 2017-01-14 DIAGNOSIS — Z0000 Encounter for general adult medical examination without abnormal findings: Secondary | ICD-10-CM | POA: Diagnosis not present

## 2017-01-19 DIAGNOSIS — Z961 Presence of intraocular lens: Secondary | ICD-10-CM | POA: Diagnosis not present

## 2017-01-27 DIAGNOSIS — I10 Essential (primary) hypertension: Secondary | ICD-10-CM | POA: Diagnosis not present

## 2017-01-27 DIAGNOSIS — I519 Heart disease, unspecified: Secondary | ICD-10-CM | POA: Diagnosis not present

## 2017-01-27 DIAGNOSIS — I420 Dilated cardiomyopathy: Secondary | ICD-10-CM | POA: Diagnosis not present

## 2017-01-27 DIAGNOSIS — I480 Paroxysmal atrial fibrillation: Secondary | ICD-10-CM | POA: Diagnosis not present

## 2017-01-27 DIAGNOSIS — I493 Ventricular premature depolarization: Secondary | ICD-10-CM | POA: Diagnosis not present

## 2017-01-27 DIAGNOSIS — I5022 Chronic systolic (congestive) heart failure: Secondary | ICD-10-CM | POA: Diagnosis not present

## 2017-01-27 DIAGNOSIS — E782 Mixed hyperlipidemia: Secondary | ICD-10-CM | POA: Diagnosis not present

## 2017-01-27 DIAGNOSIS — I472 Ventricular tachycardia: Secondary | ICD-10-CM | POA: Diagnosis not present

## 2017-01-27 DIAGNOSIS — G4733 Obstructive sleep apnea (adult) (pediatric): Secondary | ICD-10-CM | POA: Diagnosis not present

## 2017-02-14 DIAGNOSIS — I429 Cardiomyopathy, unspecified: Secondary | ICD-10-CM | POA: Diagnosis not present

## 2017-02-14 DIAGNOSIS — E785 Hyperlipidemia, unspecified: Secondary | ICD-10-CM | POA: Diagnosis not present

## 2017-02-14 DIAGNOSIS — I509 Heart failure, unspecified: Secondary | ICD-10-CM | POA: Diagnosis not present

## 2017-02-14 DIAGNOSIS — I472 Ventricular tachycardia: Secondary | ICD-10-CM | POA: Diagnosis not present

## 2017-02-14 DIAGNOSIS — I493 Ventricular premature depolarization: Secondary | ICD-10-CM | POA: Diagnosis not present

## 2017-02-14 DIAGNOSIS — Z7901 Long term (current) use of anticoagulants: Secondary | ICD-10-CM | POA: Diagnosis not present

## 2017-02-14 DIAGNOSIS — G4733 Obstructive sleep apnea (adult) (pediatric): Secondary | ICD-10-CM | POA: Diagnosis not present

## 2017-02-14 DIAGNOSIS — I480 Paroxysmal atrial fibrillation: Secondary | ICD-10-CM | POA: Diagnosis not present

## 2017-02-14 DIAGNOSIS — F17220 Nicotine dependence, chewing tobacco, uncomplicated: Secondary | ICD-10-CM | POA: Diagnosis not present

## 2017-02-24 DIAGNOSIS — Z45018 Encounter for adjustment and management of other part of cardiac pacemaker: Secondary | ICD-10-CM | POA: Diagnosis not present

## 2017-02-24 DIAGNOSIS — Z4502 Encounter for adjustment and management of automatic implantable cardiac defibrillator: Secondary | ICD-10-CM | POA: Diagnosis not present

## 2017-05-01 IMAGING — XA DG HIP (WITH PELVIS) OPERATIVE*L*
2 series · 5 of 5 positions shown · non-contrast
Comparison: None.

CLINICAL DATA: Left hip replacement

EXAM:
OPERATIVE LEFT HIP (WITH PELVIS IF PERFORMED) 2 VIEWS
TECHNIQUE: Fluoroscopic spot image(s) were submitted for interpretation
post-operatively.

[Series 1: ortho standard · 4 of 10 frames shown (1 of 2)]
[frame 2/10]
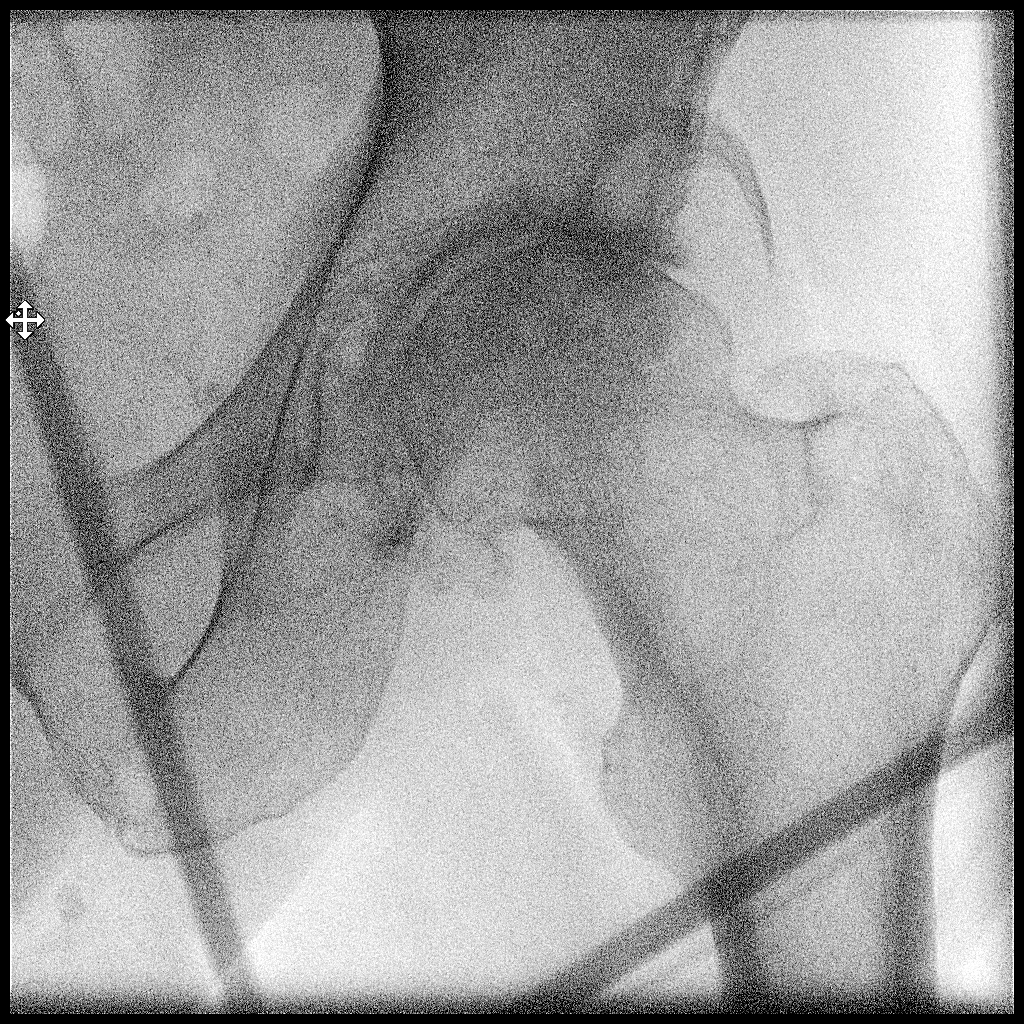
[frame 6/10]
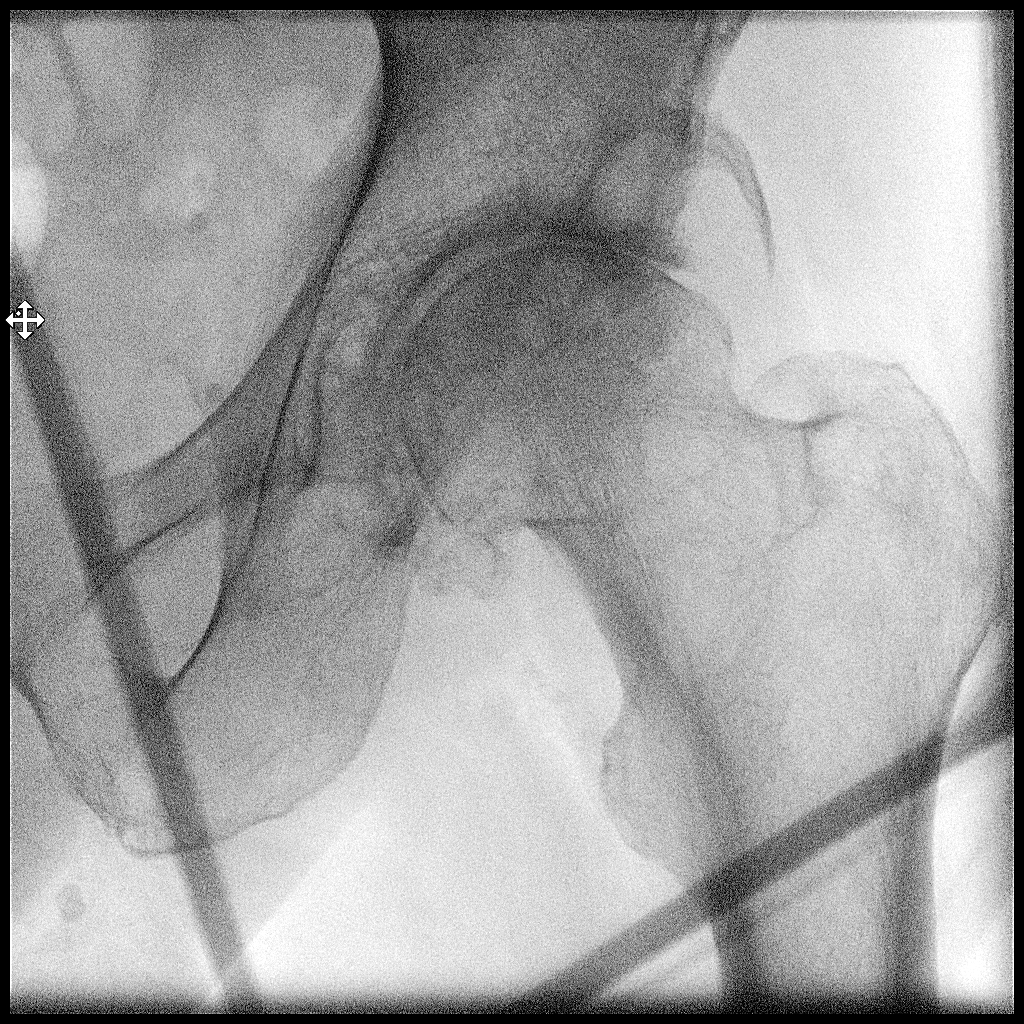
[frame 9/10]
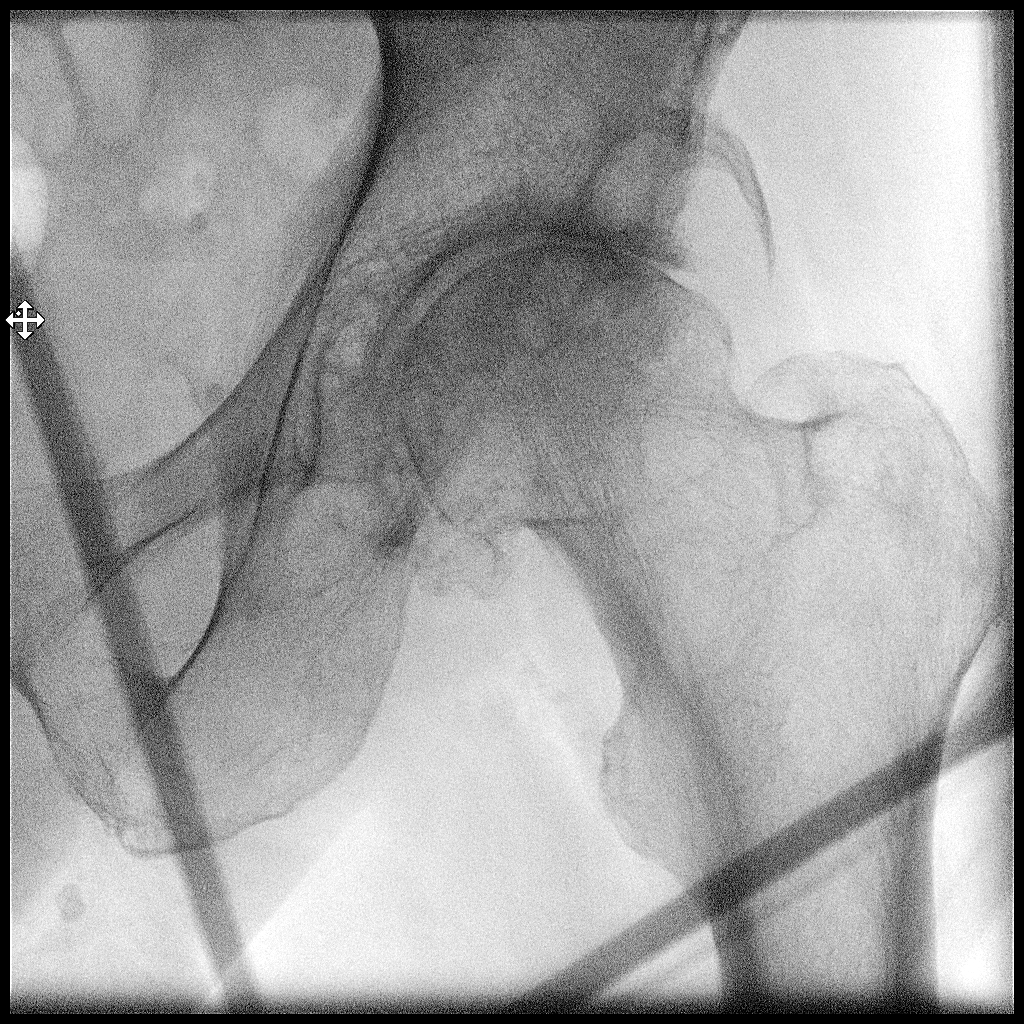
[frame 10/10]
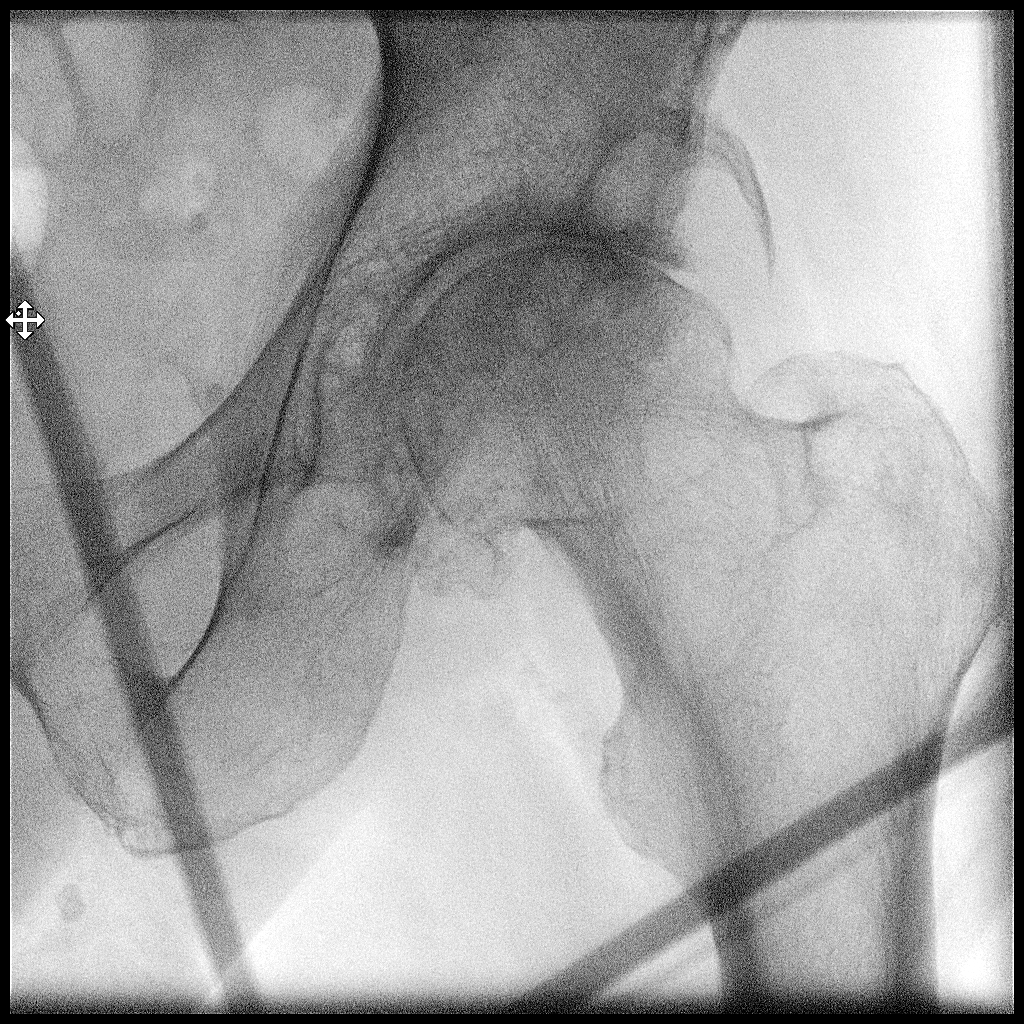

[Series 10: ortho standard · 1 of 1 slices shown (2 of 2)]
[im 1/1]
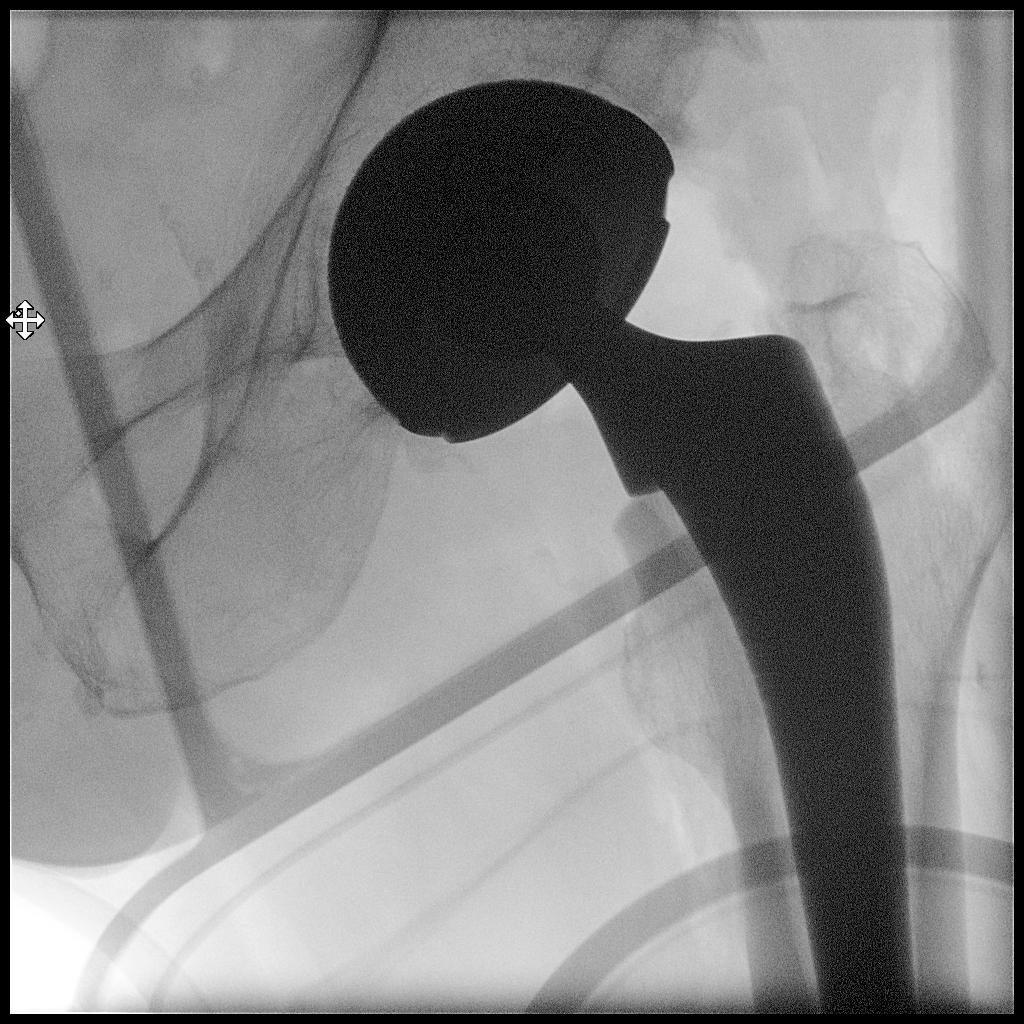

[5 of 5 positions shown; findings below may reference images not displayed]

FINDINGS: Two intraoperative spot images demonstrate left hip replacement.
Normal AP alignment. No visible complicating feature.
IMPRESSION: Left hip replacement without visible complicating feature.

## 2017-05-26 DIAGNOSIS — Z95810 Presence of automatic (implantable) cardiac defibrillator: Secondary | ICD-10-CM | POA: Diagnosis not present

## 2017-05-26 DIAGNOSIS — Z4502 Encounter for adjustment and management of automatic implantable cardiac defibrillator: Secondary | ICD-10-CM | POA: Diagnosis not present

## 2017-06-29 DIAGNOSIS — Z86010 Personal history of colonic polyps: Secondary | ICD-10-CM | POA: Diagnosis not present

## 2017-07-13 DIAGNOSIS — I10 Essential (primary) hypertension: Secondary | ICD-10-CM | POA: Diagnosis not present

## 2017-07-13 DIAGNOSIS — E782 Mixed hyperlipidemia: Secondary | ICD-10-CM | POA: Diagnosis not present

## 2017-07-13 DIAGNOSIS — R7303 Prediabetes: Secondary | ICD-10-CM | POA: Diagnosis not present

## 2017-07-20 DIAGNOSIS — R17 Unspecified jaundice: Secondary | ICD-10-CM | POA: Diagnosis not present

## 2017-07-20 DIAGNOSIS — Z0000 Encounter for general adult medical examination without abnormal findings: Secondary | ICD-10-CM | POA: Diagnosis not present

## 2017-07-20 DIAGNOSIS — E782 Mixed hyperlipidemia: Secondary | ICD-10-CM | POA: Diagnosis not present

## 2017-07-20 DIAGNOSIS — R7303 Prediabetes: Secondary | ICD-10-CM | POA: Diagnosis not present

## 2017-07-20 DIAGNOSIS — I10 Essential (primary) hypertension: Secondary | ICD-10-CM | POA: Diagnosis not present

## 2017-07-20 DIAGNOSIS — J209 Acute bronchitis, unspecified: Secondary | ICD-10-CM | POA: Diagnosis not present

## 2017-08-02 DIAGNOSIS — I519 Heart disease, unspecified: Secondary | ICD-10-CM | POA: Diagnosis not present

## 2017-08-02 DIAGNOSIS — I5022 Chronic systolic (congestive) heart failure: Secondary | ICD-10-CM | POA: Diagnosis not present

## 2017-08-02 DIAGNOSIS — I472 Ventricular tachycardia: Secondary | ICD-10-CM | POA: Diagnosis not present

## 2017-08-02 DIAGNOSIS — I420 Dilated cardiomyopathy: Secondary | ICD-10-CM | POA: Diagnosis not present

## 2017-08-02 DIAGNOSIS — I10 Essential (primary) hypertension: Secondary | ICD-10-CM | POA: Diagnosis not present

## 2017-08-02 DIAGNOSIS — G4733 Obstructive sleep apnea (adult) (pediatric): Secondary | ICD-10-CM | POA: Diagnosis not present

## 2017-08-02 DIAGNOSIS — E782 Mixed hyperlipidemia: Secondary | ICD-10-CM | POA: Diagnosis not present

## 2017-08-02 DIAGNOSIS — I493 Ventricular premature depolarization: Secondary | ICD-10-CM | POA: Diagnosis not present

## 2017-08-02 DIAGNOSIS — I480 Paroxysmal atrial fibrillation: Secondary | ICD-10-CM | POA: Diagnosis not present

## 2017-08-25 DIAGNOSIS — Z45018 Encounter for adjustment and management of other part of cardiac pacemaker: Secondary | ICD-10-CM | POA: Diagnosis not present

## 2017-09-06 DIAGNOSIS — E785 Hyperlipidemia, unspecified: Secondary | ICD-10-CM | POA: Diagnosis not present

## 2017-09-06 DIAGNOSIS — I110 Hypertensive heart disease with heart failure: Secondary | ICD-10-CM | POA: Diagnosis not present

## 2017-09-06 DIAGNOSIS — I493 Ventricular premature depolarization: Secondary | ICD-10-CM | POA: Diagnosis not present

## 2017-09-06 DIAGNOSIS — Z95810 Presence of automatic (implantable) cardiac defibrillator: Secondary | ICD-10-CM | POA: Diagnosis not present

## 2017-09-06 DIAGNOSIS — I429 Cardiomyopathy, unspecified: Secondary | ICD-10-CM | POA: Diagnosis not present

## 2017-09-06 DIAGNOSIS — I480 Paroxysmal atrial fibrillation: Secondary | ICD-10-CM | POA: Diagnosis not present

## 2017-09-06 DIAGNOSIS — I472 Ventricular tachycardia: Secondary | ICD-10-CM | POA: Diagnosis not present

## 2017-09-06 DIAGNOSIS — R002 Palpitations: Secondary | ICD-10-CM | POA: Diagnosis not present

## 2017-09-06 DIAGNOSIS — F17220 Nicotine dependence, chewing tobacco, uncomplicated: Secondary | ICD-10-CM | POA: Diagnosis not present

## 2017-09-06 DIAGNOSIS — I509 Heart failure, unspecified: Secondary | ICD-10-CM | POA: Diagnosis not present

## 2017-09-23 ENCOUNTER — Encounter: Payer: Self-pay | Admitting: *Deleted

## 2017-09-23 ENCOUNTER — Encounter
Admission: RE | Disposition: A | Discharge status: Home / Self Care | Payer: Self-pay | Source: Ambulatory Visit | Attending: Unknown Physician Specialty

## 2017-09-23 ENCOUNTER — Ambulatory Visit
Admission: RE | Admit: 2017-09-23 | Discharge: 2017-09-23 | Disposition: A | Discharge status: Home / Self Care | Payer: Medicare HMO | Source: Ambulatory Visit | Attending: Unknown Physician Specialty | Admitting: Unknown Physician Specialty

## 2017-09-23 ENCOUNTER — Ambulatory Visit: Payer: Medicare HMO | Admitting: Anesthesiology

## 2017-09-23 DIAGNOSIS — K5730 Diverticulosis of large intestine without perforation or abscess without bleeding: Secondary | ICD-10-CM | POA: Insufficient documentation

## 2017-09-23 DIAGNOSIS — I110 Hypertensive heart disease with heart failure: Secondary | ICD-10-CM | POA: Insufficient documentation

## 2017-09-23 DIAGNOSIS — K640 First degree hemorrhoids: Secondary | ICD-10-CM | POA: Insufficient documentation

## 2017-09-23 DIAGNOSIS — Z91030 Bee allergy status: Secondary | ICD-10-CM | POA: Diagnosis not present

## 2017-09-23 DIAGNOSIS — I428 Other cardiomyopathies: Secondary | ICD-10-CM | POA: Insufficient documentation

## 2017-09-23 DIAGNOSIS — Z7951 Long term (current) use of inhaled steroids: Secondary | ICD-10-CM | POA: Diagnosis not present

## 2017-09-23 DIAGNOSIS — G4730 Sleep apnea, unspecified: Secondary | ICD-10-CM | POA: Insufficient documentation

## 2017-09-23 DIAGNOSIS — K635 Polyp of colon: Secondary | ICD-10-CM | POA: Diagnosis not present

## 2017-09-23 DIAGNOSIS — Z86010 Personal history of colonic polyps: Secondary | ICD-10-CM | POA: Diagnosis not present

## 2017-09-23 DIAGNOSIS — Z951 Presence of aortocoronary bypass graft: Secondary | ICD-10-CM | POA: Diagnosis not present

## 2017-09-23 DIAGNOSIS — Z95810 Presence of automatic (implantable) cardiac defibrillator: Secondary | ICD-10-CM | POA: Diagnosis not present

## 2017-09-23 DIAGNOSIS — I2510 Atherosclerotic heart disease of native coronary artery without angina pectoris: Secondary | ICD-10-CM | POA: Insufficient documentation

## 2017-09-23 DIAGNOSIS — I252 Old myocardial infarction: Secondary | ICD-10-CM | POA: Insufficient documentation

## 2017-09-23 DIAGNOSIS — I509 Heart failure, unspecified: Secondary | ICD-10-CM | POA: Insufficient documentation

## 2017-09-23 DIAGNOSIS — D123 Benign neoplasm of transverse colon: Secondary | ICD-10-CM | POA: Diagnosis not present

## 2017-09-23 DIAGNOSIS — Z800 Family history of malignant neoplasm of digestive organs: Secondary | ICD-10-CM | POA: Insufficient documentation

## 2017-09-23 DIAGNOSIS — Z1211 Encounter for screening for malignant neoplasm of colon: Secondary | ICD-10-CM | POA: Diagnosis not present

## 2017-09-23 DIAGNOSIS — I480 Paroxysmal atrial fibrillation: Secondary | ICD-10-CM | POA: Diagnosis not present

## 2017-09-23 DIAGNOSIS — Z7901 Long term (current) use of anticoagulants: Secondary | ICD-10-CM | POA: Diagnosis not present

## 2017-09-23 DIAGNOSIS — F17220 Nicotine dependence, chewing tobacco, uncomplicated: Secondary | ICD-10-CM | POA: Diagnosis not present

## 2017-09-23 DIAGNOSIS — K5790 Diverticulosis of intestine, part unspecified, without perforation or abscess without bleeding: Secondary | ICD-10-CM | POA: Diagnosis not present

## 2017-09-23 DIAGNOSIS — I5022 Chronic systolic (congestive) heart failure: Secondary | ICD-10-CM | POA: Diagnosis not present

## 2017-09-23 DIAGNOSIS — Z79899 Other long term (current) drug therapy: Secondary | ICD-10-CM | POA: Diagnosis not present

## 2017-09-23 HISTORY — PX: COLONOSCOPY WITH PROPOFOL: SHX5780

## 2017-09-23 SURGERY — COLONOSCOPY WITH PROPOFOL
Anesthesia: General

## 2017-09-23 MED ORDER — PROPOFOL 500 MG/50ML IV EMUL
INTRAVENOUS | Status: DC | PRN
  Administered 2017-09-23: 50 ug/kg/min via INTRAVENOUS

## 2017-09-23 MED ORDER — SODIUM CHLORIDE 0.9 % IV SOLN: INTRAVENOUS | Status: DC

## 2017-09-23 MED ORDER — SODIUM CHLORIDE 0.9 % IV SOLN
INTRAVENOUS | Status: DC
  Administered 2017-09-23: 1000 mL via INTRAVENOUS

## 2017-09-23 MED ORDER — LIDOCAINE HCL (PF) 2 % IJ SOLN
INTRAMUSCULAR | Status: DC | PRN
  Administered 2017-09-23: 80 mg

## 2017-09-23 MED ORDER — PIPERACILLIN-TAZOBACTAM 3.375 G IVPB
INTRAVENOUS | Status: AC
  Administered 2017-09-23: 3.375 g via INTRAVENOUS
  Filled 2017-09-23: qty 50

## 2017-09-23 MED ORDER — LIDOCAINE HCL (PF) 2 % IJ SOLN
INTRAMUSCULAR | Status: AC
  Filled 2017-09-23: qty 10

## 2017-09-23 MED ORDER — MIDAZOLAM HCL 2 MG/2ML IJ SOLN
INTRAMUSCULAR | Status: AC
  Filled 2017-09-23: qty 2

## 2017-09-23 MED ORDER — PIPERACILLIN-TAZOBACTAM 3.375 G IVPB 30 MIN
3.3750 g | Freq: Once | INTRAVENOUS | Status: AC
  Administered 2017-09-23: 3.375 g via INTRAVENOUS
  Filled 2017-09-23: qty 50

## 2017-09-23 MED ORDER — MIDAZOLAM HCL 5 MG/5ML IJ SOLN
INTRAMUSCULAR | Status: DC | PRN
  Administered 2017-09-23: 2 mg via INTRAVENOUS

## 2017-09-23 MED ORDER — PROPOFOL 10 MG/ML IV BOLUS
INTRAVENOUS | Status: DC | PRN
  Administered 2017-09-23: 30 mg via INTRAVENOUS
  Administered 2017-09-23: 20 mg via INTRAVENOUS

## 2017-09-23 MED ORDER — PROPOFOL 500 MG/50ML IV EMUL
INTRAVENOUS | Status: AC
  Filled 2017-09-23: qty 50

## 2017-09-23 MED ORDER — FENTANYL CITRATE (PF) 100 MCG/2ML IJ SOLN
INTRAMUSCULAR | Status: AC
  Filled 2017-09-23: qty 2

## 2017-09-23 MED ORDER — FENTANYL CITRATE (PF) 100 MCG/2ML IJ SOLN
INTRAMUSCULAR | Status: DC | PRN
  Administered 2017-09-23 (×2): 50 ug via INTRAVENOUS

## 2017-09-23 NOTE — Op Note (Signed)
[facility]enter[facility] Patient Name: Jay [name] Procedure Date: 09/23/2017 11:19 AM MRN: [id number] Account #: [id number] Date of Birth: 12/17/1950 Admit Type: Outpatient Age: 67 Room: [facility] ENDO ROOM 3 Gender: Male Note Status: Finalized Procedure:            Colonoscopy Indications:          High risk colon cancer surveillance: Personal history                        of colonic polyps Providers:            Manya Silvas, MD Referring MD:         No Local Md, MD (Referring MD) Medicines:            Propofol per Anesthesia Complications:        No immediate complications. Procedure:            Pre-Anesthesia Assessment:                       - After reviewing the risks and benefits, the patient                        was deemed in satisfactory condition to undergo the                        procedure.                       After obtaining informed consent, the colonoscope was                        passed under direct vision. Throughout the procedure,                        the patient's blood pressure, pulse, and oxygen                        saturations were monitored continuously. The                        Colonoscope was introduced through the anus and                        advanced to the the cecum, identified by appendiceal                        orifice and ileocecal valve. The colonoscopy was                        performed without difficulty. The patient tolerated the                        procedure well. The quality of the bowel preparation                        was excellent. Findings:      A diminutive polyp was found in the transverse colon. The polyp was       sessile. The polyp was removed with a jumbo cold forceps. Resection and       retrieval were complete.      Internal hemorrhoids were found  during endoscopy. The hemorrhoids were       small and Grade I (internal hemorrhoids that do not prolapse).      A few small-mouthed  diverticula were found in the transverse colon.      The exam was otherwise without abnormality. Impression:           - One diminutive polyp in the transverse colon, removed                        with a jumbo cold forceps. Resected and retrieved.                       - Internal hemorrhoids.                       - Diverticulosis in the transverse colon.                       - The examination was otherwise normal. Recommendation:       - Await pathology results. Manya Silvas, MD 09/23/2017 11:42:24 AM This report has been signed electronically. Number of Addenda: 0 Note Initiated On: 09/23/2017 11:19 AM Scope Withdrawal Time: 0 hours 9 minutes 29 seconds  Total Procedure Duration: 0 hours 13 minutes 45 seconds       [facility]

## 2017-09-23 NOTE — Transfer of Care (Signed)
Immediate Anesthesia Transfer of Care Note  Patient: Jay [name]  Procedure(s) Performed: COLONOSCOPY WITH PROPOFOL (N/A )  Patient Location: PACU  Anesthesia Type:General  Level of Consciousness: sedated  Airway & Oxygen Therapy: Patient Spontanous Breathing and Patient connected to nasal cannula oxygen  Post-op Assessment: Report given to RN and Post -op Vital signs reviewed and stable  Post vital signs: Reviewed and stable  Last Vitals:  Vitals Value Taken Time  BP    Temp    Pulse 71 09/23/2017 11:45 AM  Resp 15 09/23/2017 11:45 AM  SpO2 99 % 09/23/2017 11:45 AM  Vitals shown include unvalidated device data.  Last Pain:  Vitals:   09/23/17 1009  TempSrc: Tympanic  PainSc: 0-No pain         Complications: No apparent anesthesia complications

## 2017-09-23 NOTE — Anesthesia Post-op Follow-up Note (Signed)
Anesthesia QCDR form completed.        

## 2017-09-23 NOTE — H&P (Signed)
Primary Care Physician:  Katheren Shams Primary Gastroenterologist:  Dr. Vira Agar  Pre-Procedure History & Physical: HPI:  Jay [name] is a 67 y.o. male is here for an colonoscopy.  Done for [facility] colon polyps..   Past Medical History:  Diagnosis Date  . AICD (automatic cardioverter/defibrillator) present    05/08/15  . Cardiomyopathy ([place])    NONISCHEMIC DILATED  . CHF (congestive heart failure) (HCC)    CHRONIC  . Complication of anesthesia    woke up doing surgeries  . Coronary artery disease   . Dyspnea    CHRONIC DOE  . Dysrhythmia    SUSTAINED VT/BRADYCARDIA/PAROXYSMAL A FIB  . Hypertension   . MI (myocardial infarction) ([place])   . Presence of permanent cardiac pacemaker   . Sleep apnea    no cpap    Past Surgical History:  Procedure Laterality Date  . ABLATION     FOR V/T 08/01/15  . CARDIAC CATHETERIZATION N/A 05/06/2015   Procedure: Left Heart Cath and Coronary Angiography;  Surgeon: Isaias Cowman, MD;  Location: [place] [facility];  Service: Cardiovascular;  Laterality: N/A;  . CATARACT EXTRACTION    . CORONARY ARTERY BYPASS GRAFT    . EYE SURGERY    . FRACTURE SURGERY    . INSERT / REPLACE / REMOVE PACEMAKER    . JOINT REPLACEMENT    . RETINAL DETACHMENT SURGERY    . TOTAL HIP ARTHROPLASTY Left 02/10/2016   Procedure: TOTAL HIP ARTHROPLASTY ANTERIOR APPROACH;  Surgeon: Hessie Knows, MD;  Location: [facility] OR[facility];  Service: Orthopedics;  Laterality: Left;    Prior to Admission medications   Medication Sig Start Date End Date Taking? Authorizing Provider  apixaban (ELIQUIS) 5 MG TABS tablet Take 5 mg by mouth 2 (two) times daily.   Yes [provider]  Cholecalciferol (VITAMIN D) 2000 units tablet Take 2,000 Units by mouth daily.   Yes [provider]  Cyanocobalamin (RA VITAMIN B-12 TR) 1000 MCG TBCR Take 1,000 mcg by mouth every morning.    Yes [provider]  fluticasone (FLONASE) 50 MCG/ACT nasal spray Place 2  sprays into the nose daily as needed for allergies.  01/08/14  Yes [provider]  lisinopril (PRINIVIL,ZESTRIL) 5 MG tablet Take 5 mg by mouth every morning. 04/20/15  Yes [provider]  lovastatin (MEVACOR) 40 MG tablet Take 40 mg by mouth daily after supper.  05/02/15  Yes [provider]  Multiple Vitamins-Minerals (CENTRUM SILVER ULTRA MENS) TABS Take 1 tablet by mouth every morning.   Yes [provider]  oxyCODONE (OXY IR/ROXICODONE) 5 MG immediate release tablet Take 1-2 tablets (5-10 mg total) by mouth every 3 (three) hours as needed for breakthrough pain. 02/12/16  Yes Duanne Guess, PA-C  sotalol (BETAPACE) 80 MG tablet Take 80 mg by mouth 2 (two) times daily.   Yes [provider]  vitamin C (ASCORBIC ACID) 500 MG tablet Take 500 mg by mouth daily.   Yes [provider]  vitamin E 400 UNIT capsule Take 400 Units by mouth every morning.  06/24/10  Yes [provider]    Allergies as of 08/03/2017 - Review Complete 02/10/2016  Allergen Reaction Noted  . Pollen extract  07/22/2015    Family History  Problem Relation Age of Onset  . Stomach cancer Mother   . Heart disease Father     Social History   Socioeconomic History  . Marital status: Married    Spouse name: Not on file  .  Number of children: Not on file  . Years of education: Not on file  . Highest education level: Not on file  Occupational History  . Not on file  Social Needs  . Financial resource strain: Not on file  . Food insecurity:    Worry: Not on file    Inability: Not on file  . Transportation needs:    Medical: Not on file    Non-medical: Not on file  Tobacco Use  . Smoking status: Never Smoker  . Smokeless tobacco: Current User    Types: Chew  Substance and Sexual Activity  . Alcohol use: No  . Drug use: No  . Sexual activity: Not on file  Lifestyle  . Physical activity:    Days per week: Not on file    Minutes per session: Not on  file  . Stress: Not on file  Relationships  . Social connections:    Talks on phone: Not on file    Gets together: Not on file    Attends religious service: Not on file    Active member of club or organization: Not on file    Attends meetings of clubs or organizations: Not on file    Relationship status: Not on file  . Intimate partner violence:    Fear of current or ex partner: Not on file    Emotionally abused: Not on file    Physically abused: Not on file    Forced sexual activity: Not on file  Other Topics Concern  . Not on file  Social History Narrative  . Not on file    Review of Systems: See HPI, otherwise negative ROS  Physical Exam: BP (!) 124/91   Pulse 96   Temp 98.6 F (37 C) (Tympanic)   Resp 18   Ht 6\' 2"  (1.88 m)   Wt 97.5 kg (215 lb)   SpO2 100%   BMI 27.60 kg/m  General:   Alert,  pleasant and cooperative in NAD Head:  Normocephalic and atraumatic. Neck:  Supple; no masses or thyromegaly. Lungs:  Clear throughout to auscultation.    Heart:  Regular rate and rhythm. Abdomen:  Soft, nontender and nondistended. Normal bowel sounds, without guarding, and without rebound.   Neurologic:  Alert and  oriented x4;  grossly normal neurologically.  Impression/Plan: [name] is here for an colonoscopy to be performed for personal history of colon polyps.  Risks, benefits, limitations, and alternatives regarding  colonoscopy have been reviewed with the patient.  Questions have been answered.  All parties agreeable.   Gaylyn Cheers, MD  09/23/2017, 11:16 AM

## 2017-09-23 NOTE — Anesthesia Preprocedure Evaluation (Signed)
Anesthesia Evaluation  Patient identified by MRN, date of birth, ID band Patient awake    Reviewed: Allergy & Precautions, H&P , NPO status , Patient's Chart, lab work & pertinent test results, reviewed documented beta blocker date and time   History of Anesthesia Complications (+) history of anesthetic complications  Airway Mallampati: II   Neck ROM: full    Dental  (+) Poor Dentition   Pulmonary neg pulmonary ROS, shortness of breath and with exertion, sleep apnea ,    Pulmonary exam normal        Cardiovascular Exercise Tolerance: Good hypertension, On Medications + CAD, + Past MI and +CHF  negative cardio ROS Normal cardiovascular exam+ dysrhythmias + pacemaker + Cardiac Defibrillator  Rhythm:regular Rate:Normal     Neuro/Psych negative neurological ROS  negative psych ROS   GI/Hepatic negative GI ROS, Neg liver ROS,   Endo/Other  negative endocrine ROS  Renal/GU negative Renal ROS  negative genitourinary   Musculoskeletal   Abdominal   Peds  Hematology negative hematology ROS (+)   Anesthesia Other Findings Past Medical History: No date: AICD (automatic cardioverter/defibrillator) present     Comment:  05/08/15 No date: Cardiomyopathy ([place])     Comment:  NONISCHEMIC DILATED No date: CHF (congestive heart failure) (HCC)     Comment:  CHRONIC No date: Complication of anesthesia     Comment:  woke up doing surgeries No date: Coronary artery disease No date: Dyspnea     Comment:  CHRONIC DOE No date: Dysrhythmia     Comment:  SUSTAINED VT/BRADYCARDIA/PAROXYSMAL A FIB No date: Hypertension No date: MI (myocardial infarction) ([place]) No date: Presence of permanent cardiac pacemaker No date: Sleep apnea     Comment:  no cpap Past Surgical History: No date: ABLATION     Comment:  FOR V/T 08/01/15 05/06/2015: CARDIAC CATHETERIZATION; N/A     Comment:  Procedure: Left Heart Cath and Coronary Angiography;                 Surgeon: Isaias Cowman, MD;  Location: [employer] [facility];  Service: Cardiovascular;  Laterality:               N/A; No date: CATARACT EXTRACTION No date: CORONARY ARTERY BYPASS GRAFT No date: EYE SURGERY No date: FRACTURE SURGERY No date: INSERT / REPLACE / REMOVE PACEMAKER No date: JOINT REPLACEMENT No date: RETINAL DETACHMENT SURGERY 02/10/2016: TOTAL HIP ARTHROPLASTY; Left     Comment:  Procedure: TOTAL HIP ARTHROPLASTY ANTERIOR APPROACH;                Surgeon: Hessie Knows, MD;  Location: [facility];  Service:              Orthopedics;  Laterality: Left; BMI    Body Mass Index:  27.60 kg/m     Reproductive/Obstetrics negative OB ROS                             Anesthesia Physical Anesthesia Plan  ASA: III  Anesthesia Plan: General   Post-op Pain Management:    Induction:   PONV Risk Score and Plan:   Airway Management Planned:   Additional Equipment:   Intra-op Plan:   Post-operative Plan:   Informed Consent: I have reviewed the patients History and Physical, chart, labs and discussed the procedure including the risks, benefits and alternatives for the proposed  anesthesia with the patient or authorized representative who has indicated his/her understanding and acceptance.   Dental Advisory Given  Plan Discussed with: CRNA  Anesthesia Plan Comments:         Anesthesia Quick Evaluation

## 2017-09-27 LAB — SURGICAL PATHOLOGY

## 2017-09-28 ENCOUNTER — Encounter: Payer: Self-pay | Admitting: Unknown Physician Specialty

## 2017-09-29 NOTE — Anesthesia Postprocedure Evaluation (Signed)
Anesthesia Post Note  Patient: Jay [name]  Procedure(s) Performed: COLONOSCOPY WITH PROPOFOL (N/A )  Patient location during evaluation: PACU Anesthesia Type: General Level of consciousness: awake and alert Pain management: pain level controlled Vital Signs Assessment: post-procedure vital signs reviewed and stable Respiratory status: spontaneous breathing, nonlabored ventilation, respiratory function stable and patient connected to nasal cannula oxygen Cardiovascular status: blood pressure returned to baseline and stable Postop Assessment: no apparent nausea or vomiting Anesthetic complications: no     Last Vitals:  Vitals:   09/23/17 1205 09/23/17 1215  BP: 98/73 (!) 95/51  Pulse: 74 70  Resp: 16 15  Temp:    SpO2: 98% 99%    Last Pain:  Vitals:   09/24/17 1228  TempSrc:   PainSc: 0-No pain                 Molli Barrows

## 2017-10-03 DIAGNOSIS — R0781 Pleurodynia: Secondary | ICD-10-CM | POA: Diagnosis not present

## 2017-11-24 DIAGNOSIS — Z4502 Encounter for adjustment and management of automatic implantable cardiac defibrillator: Secondary | ICD-10-CM | POA: Diagnosis not present

## 2017-11-24 DIAGNOSIS — I493 Ventricular premature depolarization: Secondary | ICD-10-CM | POA: Diagnosis not present

## 2018-02-07 DIAGNOSIS — I493 Ventricular premature depolarization: Secondary | ICD-10-CM | POA: Diagnosis not present

## 2018-02-07 DIAGNOSIS — I480 Paroxysmal atrial fibrillation: Secondary | ICD-10-CM | POA: Diagnosis not present

## 2018-02-07 DIAGNOSIS — I420 Dilated cardiomyopathy: Secondary | ICD-10-CM | POA: Diagnosis not present

## 2018-02-07 DIAGNOSIS — I5022 Chronic systolic (congestive) heart failure: Secondary | ICD-10-CM | POA: Diagnosis not present

## 2018-02-07 DIAGNOSIS — I519 Heart disease, unspecified: Secondary | ICD-10-CM | POA: Diagnosis not present

## 2018-02-07 DIAGNOSIS — I472 Ventricular tachycardia: Secondary | ICD-10-CM | POA: Diagnosis not present

## 2018-02-07 DIAGNOSIS — I10 Essential (primary) hypertension: Secondary | ICD-10-CM | POA: Diagnosis not present

## 2018-02-07 DIAGNOSIS — G4733 Obstructive sleep apnea (adult) (pediatric): Secondary | ICD-10-CM | POA: Diagnosis not present

## 2018-02-07 DIAGNOSIS — Z01810 Encounter for preprocedural cardiovascular examination: Secondary | ICD-10-CM | POA: Diagnosis not present

## 2018-02-17 DIAGNOSIS — E782 Mixed hyperlipidemia: Secondary | ICD-10-CM | POA: Diagnosis not present

## 2018-02-17 DIAGNOSIS — I10 Essential (primary) hypertension: Secondary | ICD-10-CM | POA: Diagnosis not present

## 2018-02-17 DIAGNOSIS — R7303 Prediabetes: Secondary | ICD-10-CM | POA: Diagnosis not present

## 2018-02-17 DIAGNOSIS — D638 Anemia in other chronic diseases classified elsewhere: Secondary | ICD-10-CM | POA: Diagnosis not present

## 2018-02-20 DIAGNOSIS — D638 Anemia in other chronic diseases classified elsewhere: Secondary | ICD-10-CM | POA: Diagnosis not present

## 2018-02-20 DIAGNOSIS — E782 Mixed hyperlipidemia: Secondary | ICD-10-CM | POA: Diagnosis not present

## 2018-02-20 DIAGNOSIS — I10 Essential (primary) hypertension: Secondary | ICD-10-CM | POA: Diagnosis not present

## 2018-02-20 DIAGNOSIS — R7303 Prediabetes: Secondary | ICD-10-CM | POA: Diagnosis not present

## 2018-02-23 DIAGNOSIS — I480 Paroxysmal atrial fibrillation: Secondary | ICD-10-CM | POA: Diagnosis not present

## 2018-02-23 DIAGNOSIS — Z4502 Encounter for adjustment and management of automatic implantable cardiac defibrillator: Secondary | ICD-10-CM | POA: Diagnosis not present

## 2018-07-18 DIAGNOSIS — I5022 Chronic systolic (congestive) heart failure: Secondary | ICD-10-CM | POA: Diagnosis not present

## 2018-08-10 DIAGNOSIS — I472 Ventricular tachycardia: Secondary | ICD-10-CM | POA: Diagnosis not present

## 2018-08-10 DIAGNOSIS — I10 Essential (primary) hypertension: Secondary | ICD-10-CM | POA: Diagnosis not present

## 2018-08-10 DIAGNOSIS — I2510 Atherosclerotic heart disease of native coronary artery without angina pectoris: Secondary | ICD-10-CM | POA: Diagnosis not present

## 2018-08-10 DIAGNOSIS — G4733 Obstructive sleep apnea (adult) (pediatric): Secondary | ICD-10-CM | POA: Diagnosis not present

## 2018-08-10 DIAGNOSIS — I493 Ventricular premature depolarization: Secondary | ICD-10-CM | POA: Diagnosis not present

## 2018-08-10 DIAGNOSIS — I420 Dilated cardiomyopathy: Secondary | ICD-10-CM | POA: Diagnosis not present

## 2018-08-10 DIAGNOSIS — I5022 Chronic systolic (congestive) heart failure: Secondary | ICD-10-CM | POA: Diagnosis not present

## 2018-08-10 DIAGNOSIS — I480 Paroxysmal atrial fibrillation: Secondary | ICD-10-CM | POA: Diagnosis not present

## 2018-08-11 DIAGNOSIS — I10 Essential (primary) hypertension: Secondary | ICD-10-CM | POA: Diagnosis not present

## 2018-08-11 DIAGNOSIS — R7303 Prediabetes: Secondary | ICD-10-CM | POA: Diagnosis not present

## 2018-08-11 DIAGNOSIS — E782 Mixed hyperlipidemia: Secondary | ICD-10-CM | POA: Diagnosis not present

## 2018-08-18 DIAGNOSIS — R17 Unspecified jaundice: Secondary | ICD-10-CM | POA: Diagnosis not present

## 2018-08-18 DIAGNOSIS — I10 Essential (primary) hypertension: Secondary | ICD-10-CM | POA: Diagnosis not present

## 2018-08-18 DIAGNOSIS — R7303 Prediabetes: Secondary | ICD-10-CM | POA: Diagnosis not present

## 2018-08-18 DIAGNOSIS — Z0000 Encounter for general adult medical examination without abnormal findings: Secondary | ICD-10-CM | POA: Diagnosis not present

## 2018-08-18 DIAGNOSIS — E782 Mixed hyperlipidemia: Secondary | ICD-10-CM | POA: Diagnosis not present

## 2018-09-06 DIAGNOSIS — Z961 Presence of intraocular lens: Secondary | ICD-10-CM | POA: Diagnosis not present

## 2018-10-23 DIAGNOSIS — G4733 Obstructive sleep apnea (adult) (pediatric): Secondary | ICD-10-CM | POA: Diagnosis not present

## 2018-10-23 DIAGNOSIS — Z9889 Other specified postprocedural states: Secondary | ICD-10-CM | POA: Diagnosis not present

## 2018-10-23 DIAGNOSIS — E782 Mixed hyperlipidemia: Secondary | ICD-10-CM | POA: Diagnosis not present

## 2018-10-23 DIAGNOSIS — R000 Tachycardia, unspecified: Secondary | ICD-10-CM | POA: Diagnosis not present

## 2018-10-23 DIAGNOSIS — I2510 Atherosclerotic heart disease of native coronary artery without angina pectoris: Secondary | ICD-10-CM | POA: Diagnosis not present

## 2018-10-23 DIAGNOSIS — I10 Essential (primary) hypertension: Secondary | ICD-10-CM | POA: Diagnosis not present

## 2018-10-23 DIAGNOSIS — Z95810 Presence of automatic (implantable) cardiac defibrillator: Secondary | ICD-10-CM | POA: Diagnosis not present

## 2018-10-23 DIAGNOSIS — I252 Old myocardial infarction: Secondary | ICD-10-CM | POA: Diagnosis not present

## 2018-10-23 DIAGNOSIS — I480 Paroxysmal atrial fibrillation: Secondary | ICD-10-CM | POA: Diagnosis not present

## 2018-10-31 DIAGNOSIS — I472 Ventricular tachycardia: Secondary | ICD-10-CM | POA: Diagnosis not present

## 2018-11-21 DIAGNOSIS — I5022 Chronic systolic (congestive) heart failure: Secondary | ICD-10-CM | POA: Diagnosis not present

## 2019-01-11 DIAGNOSIS — G4733 Obstructive sleep apnea (adult) (pediatric): Secondary | ICD-10-CM | POA: Diagnosis not present

## 2019-01-11 DIAGNOSIS — R0602 Shortness of breath: Secondary | ICD-10-CM | POA: Diagnosis not present

## 2019-01-11 DIAGNOSIS — I5022 Chronic systolic (congestive) heart failure: Secondary | ICD-10-CM | POA: Diagnosis not present

## 2019-01-11 DIAGNOSIS — I10 Essential (primary) hypertension: Secondary | ICD-10-CM | POA: Diagnosis not present

## 2019-01-11 DIAGNOSIS — E782 Mixed hyperlipidemia: Secondary | ICD-10-CM | POA: Diagnosis not present

## 2019-01-11 DIAGNOSIS — I252 Old myocardial infarction: Secondary | ICD-10-CM | POA: Diagnosis not present

## 2019-01-11 DIAGNOSIS — Z9889 Other specified postprocedural states: Secondary | ICD-10-CM | POA: Diagnosis not present

## 2019-01-11 DIAGNOSIS — I480 Paroxysmal atrial fibrillation: Secondary | ICD-10-CM | POA: Diagnosis not present

## 2019-01-11 DIAGNOSIS — Z95810 Presence of automatic (implantable) cardiac defibrillator: Secondary | ICD-10-CM | POA: Diagnosis not present

## 2019-01-17 DIAGNOSIS — I5022 Chronic systolic (congestive) heart failure: Secondary | ICD-10-CM | POA: Diagnosis not present

## 2019-01-19 DIAGNOSIS — Z95810 Presence of automatic (implantable) cardiac defibrillator: Secondary | ICD-10-CM | POA: Diagnosis not present

## 2019-01-19 DIAGNOSIS — I5022 Chronic systolic (congestive) heart failure: Secondary | ICD-10-CM | POA: Diagnosis not present

## 2019-01-19 DIAGNOSIS — G4733 Obstructive sleep apnea (adult) (pediatric): Secondary | ICD-10-CM | POA: Diagnosis not present

## 2019-01-19 DIAGNOSIS — I10 Essential (primary) hypertension: Secondary | ICD-10-CM | POA: Diagnosis not present

## 2019-01-19 DIAGNOSIS — Z9889 Other specified postprocedural states: Secondary | ICD-10-CM | POA: Diagnosis not present

## 2019-01-19 DIAGNOSIS — E782 Mixed hyperlipidemia: Secondary | ICD-10-CM | POA: Diagnosis not present

## 2019-01-19 DIAGNOSIS — I480 Paroxysmal atrial fibrillation: Secondary | ICD-10-CM | POA: Diagnosis not present

## 2019-02-14 DIAGNOSIS — R7303 Prediabetes: Secondary | ICD-10-CM | POA: Diagnosis not present

## 2019-02-14 DIAGNOSIS — E782 Mixed hyperlipidemia: Secondary | ICD-10-CM | POA: Diagnosis not present

## 2019-02-14 DIAGNOSIS — I10 Essential (primary) hypertension: Secondary | ICD-10-CM | POA: Diagnosis not present

## 2019-02-21 DIAGNOSIS — M79671 Pain in right foot: Secondary | ICD-10-CM | POA: Diagnosis not present

## 2019-02-21 DIAGNOSIS — E1169 Type 2 diabetes mellitus with other specified complication: Secondary | ICD-10-CM | POA: Diagnosis not present

## 2019-02-21 DIAGNOSIS — I10 Essential (primary) hypertension: Secondary | ICD-10-CM | POA: Diagnosis not present

## 2019-02-21 DIAGNOSIS — R05 Cough: Secondary | ICD-10-CM | POA: Diagnosis not present

## 2019-02-21 DIAGNOSIS — E782 Mixed hyperlipidemia: Secondary | ICD-10-CM | POA: Diagnosis not present

## 2019-02-21 DIAGNOSIS — L989 Disorder of the skin and subcutaneous tissue, unspecified: Secondary | ICD-10-CM | POA: Diagnosis not present

## 2019-02-26 DIAGNOSIS — M778 Other enthesopathies, not elsewhere classified: Secondary | ICD-10-CM | POA: Diagnosis not present

## 2019-02-26 DIAGNOSIS — M2041 Other hammer toe(s) (acquired), right foot: Secondary | ICD-10-CM | POA: Diagnosis not present

## 2019-02-26 DIAGNOSIS — M19071 Primary osteoarthritis, right ankle and foot: Secondary | ICD-10-CM | POA: Diagnosis not present

## 2019-02-26 DIAGNOSIS — E119 Type 2 diabetes mellitus without complications: Secondary | ICD-10-CM | POA: Diagnosis not present

## 2019-02-26 DIAGNOSIS — M79671 Pain in right foot: Secondary | ICD-10-CM | POA: Diagnosis not present

## 2019-03-20 DIAGNOSIS — I5022 Chronic systolic (congestive) heart failure: Secondary | ICD-10-CM | POA: Diagnosis not present

## 2019-04-16 DIAGNOSIS — D2239 Melanocytic nevi of other parts of face: Secondary | ICD-10-CM | POA: Diagnosis not present

## 2019-04-16 DIAGNOSIS — L570 Actinic keratosis: Secondary | ICD-10-CM | POA: Diagnosis not present

## 2019-04-16 DIAGNOSIS — D485 Neoplasm of uncertain behavior of skin: Secondary | ICD-10-CM | POA: Diagnosis not present

## 2019-04-16 DIAGNOSIS — L821 Other seborrheic keratosis: Secondary | ICD-10-CM | POA: Diagnosis not present

## 2019-04-16 DIAGNOSIS — C4441 Basal cell carcinoma of skin of scalp and neck: Secondary | ICD-10-CM | POA: Diagnosis not present

## 2019-04-19 DIAGNOSIS — Z9889 Other specified postprocedural states: Secondary | ICD-10-CM | POA: Diagnosis not present

## 2019-04-19 DIAGNOSIS — I8391 Asymptomatic varicose veins of right lower extremity: Secondary | ICD-10-CM | POA: Diagnosis not present

## 2019-04-19 DIAGNOSIS — I480 Paroxysmal atrial fibrillation: Secondary | ICD-10-CM | POA: Diagnosis not present

## 2019-04-19 DIAGNOSIS — I5022 Chronic systolic (congestive) heart failure: Secondary | ICD-10-CM | POA: Diagnosis not present

## 2019-04-19 DIAGNOSIS — Z95810 Presence of automatic (implantable) cardiac defibrillator: Secondary | ICD-10-CM | POA: Diagnosis not present

## 2019-04-19 DIAGNOSIS — G4733 Obstructive sleep apnea (adult) (pediatric): Secondary | ICD-10-CM | POA: Diagnosis not present

## 2019-04-19 DIAGNOSIS — E1169 Type 2 diabetes mellitus with other specified complication: Secondary | ICD-10-CM | POA: Diagnosis not present

## 2019-04-19 DIAGNOSIS — I10 Essential (primary) hypertension: Secondary | ICD-10-CM | POA: Diagnosis not present

## 2019-04-19 DIAGNOSIS — I2510 Atherosclerotic heart disease of native coronary artery without angina pectoris: Secondary | ICD-10-CM | POA: Diagnosis not present

## 2019-05-08 DIAGNOSIS — C4441 Basal cell carcinoma of skin of scalp and neck: Secondary | ICD-10-CM | POA: Diagnosis not present

## 2019-05-08 DIAGNOSIS — C4491 Basal cell carcinoma of skin, unspecified: Secondary | ICD-10-CM | POA: Diagnosis not present

## 2019-06-14 DIAGNOSIS — E1169 Type 2 diabetes mellitus with other specified complication: Secondary | ICD-10-CM | POA: Diagnosis not present

## 2019-06-14 DIAGNOSIS — E785 Hyperlipidemia, unspecified: Secondary | ICD-10-CM | POA: Diagnosis not present

## 2019-06-14 DIAGNOSIS — E782 Mixed hyperlipidemia: Secondary | ICD-10-CM | POA: Diagnosis not present

## 2019-06-21 DIAGNOSIS — I10 Essential (primary) hypertension: Secondary | ICD-10-CM | POA: Diagnosis not present

## 2019-06-21 DIAGNOSIS — E785 Hyperlipidemia, unspecified: Secondary | ICD-10-CM | POA: Diagnosis not present

## 2019-06-21 DIAGNOSIS — Z20822 Contact with and (suspected) exposure to covid-19: Secondary | ICD-10-CM | POA: Diagnosis not present

## 2019-06-21 DIAGNOSIS — E782 Mixed hyperlipidemia: Secondary | ICD-10-CM | POA: Diagnosis not present

## 2019-06-21 DIAGNOSIS — E1169 Type 2 diabetes mellitus with other specified complication: Secondary | ICD-10-CM | POA: Diagnosis not present

## 2019-06-21 DIAGNOSIS — R05 Cough: Secondary | ICD-10-CM | POA: Diagnosis not present

## 2019-06-26 DIAGNOSIS — I5022 Chronic systolic (congestive) heart failure: Secondary | ICD-10-CM | POA: Diagnosis not present

## 2019-07-03 DIAGNOSIS — Z20822 Contact with and (suspected) exposure to covid-19: Secondary | ICD-10-CM | POA: Diagnosis not present

## 2019-07-03 DIAGNOSIS — Z01818 Encounter for other preprocedural examination: Secondary | ICD-10-CM | POA: Diagnosis not present

## 2019-07-03 DIAGNOSIS — R05 Cough: Secondary | ICD-10-CM | POA: Diagnosis not present

## 2019-08-15 DIAGNOSIS — I5022 Chronic systolic (congestive) heart failure: Secondary | ICD-10-CM | POA: Diagnosis not present

## 2019-08-15 DIAGNOSIS — I2510 Atherosclerotic heart disease of native coronary artery without angina pectoris: Secondary | ICD-10-CM | POA: Diagnosis not present

## 2019-08-15 DIAGNOSIS — Z95810 Presence of automatic (implantable) cardiac defibrillator: Secondary | ICD-10-CM | POA: Diagnosis not present

## 2019-08-15 DIAGNOSIS — R002 Palpitations: Secondary | ICD-10-CM | POA: Diagnosis not present

## 2019-08-15 DIAGNOSIS — I480 Paroxysmal atrial fibrillation: Secondary | ICD-10-CM | POA: Diagnosis not present

## 2019-08-15 DIAGNOSIS — I10 Essential (primary) hypertension: Secondary | ICD-10-CM | POA: Diagnosis not present

## 2019-09-12 DIAGNOSIS — I472 Ventricular tachycardia: Secondary | ICD-10-CM | POA: Diagnosis not present

## 2019-11-05 DIAGNOSIS — Z872 Personal history of diseases of the skin and subcutaneous tissue: Secondary | ICD-10-CM | POA: Diagnosis not present

## 2019-11-05 DIAGNOSIS — Z85828 Personal history of other malignant neoplasm of skin: Secondary | ICD-10-CM | POA: Diagnosis not present

## 2019-11-05 DIAGNOSIS — D3610 Benign neoplasm of peripheral nerves and autonomic nervous system, unspecified: Secondary | ICD-10-CM | POA: Diagnosis not present

## 2019-11-05 DIAGNOSIS — L578 Other skin changes due to chronic exposure to nonionizing radiation: Secondary | ICD-10-CM | POA: Diagnosis not present

## 2019-11-05 DIAGNOSIS — L570 Actinic keratosis: Secondary | ICD-10-CM | POA: Diagnosis not present

## 2019-11-30 DIAGNOSIS — Z20828 Contact with and (suspected) exposure to other viral communicable diseases: Secondary | ICD-10-CM | POA: Diagnosis not present

## 2019-11-30 DIAGNOSIS — J029 Acute pharyngitis, unspecified: Secondary | ICD-10-CM | POA: Diagnosis not present

## 2019-11-30 DIAGNOSIS — J0110 Acute frontal sinusitis, unspecified: Secondary | ICD-10-CM | POA: Diagnosis not present

## 2019-12-03 DIAGNOSIS — E782 Mixed hyperlipidemia: Secondary | ICD-10-CM | POA: Diagnosis not present

## 2019-12-03 DIAGNOSIS — E1169 Type 2 diabetes mellitus with other specified complication: Secondary | ICD-10-CM | POA: Diagnosis not present

## 2019-12-03 DIAGNOSIS — E785 Hyperlipidemia, unspecified: Secondary | ICD-10-CM | POA: Diagnosis not present

## 2019-12-03 DIAGNOSIS — I10 Essential (primary) hypertension: Secondary | ICD-10-CM | POA: Diagnosis not present

## 2019-12-04 DIAGNOSIS — Z961 Presence of intraocular lens: Secondary | ICD-10-CM | POA: Diagnosis not present

## 2019-12-11 DIAGNOSIS — E785 Hyperlipidemia, unspecified: Secondary | ICD-10-CM | POA: Diagnosis not present

## 2019-12-11 DIAGNOSIS — Z0000 Encounter for general adult medical examination without abnormal findings: Secondary | ICD-10-CM | POA: Diagnosis not present

## 2019-12-11 DIAGNOSIS — E119 Type 2 diabetes mellitus without complications: Secondary | ICD-10-CM | POA: Diagnosis not present

## 2019-12-11 DIAGNOSIS — I10 Essential (primary) hypertension: Secondary | ICD-10-CM | POA: Diagnosis not present

## 2019-12-19 DIAGNOSIS — Z9889 Other specified postprocedural states: Secondary | ICD-10-CM | POA: Diagnosis not present

## 2019-12-19 DIAGNOSIS — Z95810 Presence of automatic (implantable) cardiac defibrillator: Secondary | ICD-10-CM | POA: Diagnosis not present

## 2019-12-19 DIAGNOSIS — I480 Paroxysmal atrial fibrillation: Secondary | ICD-10-CM | POA: Diagnosis not present

## 2019-12-19 DIAGNOSIS — I2510 Atherosclerotic heart disease of native coronary artery without angina pectoris: Secondary | ICD-10-CM | POA: Diagnosis not present

## 2019-12-19 DIAGNOSIS — I252 Old myocardial infarction: Secondary | ICD-10-CM | POA: Diagnosis not present

## 2019-12-19 DIAGNOSIS — Z23 Encounter for immunization: Secondary | ICD-10-CM | POA: Diagnosis not present

## 2019-12-19 DIAGNOSIS — I5022 Chronic systolic (congestive) heart failure: Secondary | ICD-10-CM | POA: Diagnosis not present

## 2019-12-19 DIAGNOSIS — E1169 Type 2 diabetes mellitus with other specified complication: Secondary | ICD-10-CM | POA: Diagnosis not present

## 2019-12-19 DIAGNOSIS — I10 Essential (primary) hypertension: Secondary | ICD-10-CM | POA: Diagnosis not present

## 2020-01-01 DIAGNOSIS — I5022 Chronic systolic (congestive) heart failure: Secondary | ICD-10-CM | POA: Diagnosis not present

## 2020-03-21 DIAGNOSIS — M3501 Sicca syndrome with keratoconjunctivitis: Secondary | ICD-10-CM | POA: Diagnosis not present

## 2020-04-22 DIAGNOSIS — Z95810 Presence of automatic (implantable) cardiac defibrillator: Secondary | ICD-10-CM | POA: Diagnosis not present

## 2020-04-22 DIAGNOSIS — I10 Essential (primary) hypertension: Secondary | ICD-10-CM | POA: Diagnosis not present

## 2020-04-22 DIAGNOSIS — I480 Paroxysmal atrial fibrillation: Secondary | ICD-10-CM | POA: Diagnosis not present

## 2020-04-22 DIAGNOSIS — I5022 Chronic systolic (congestive) heart failure: Secondary | ICD-10-CM | POA: Diagnosis not present

## 2020-04-22 DIAGNOSIS — Z23 Encounter for immunization: Secondary | ICD-10-CM | POA: Diagnosis not present

## 2020-06-02 DIAGNOSIS — E785 Hyperlipidemia, unspecified: Secondary | ICD-10-CM | POA: Diagnosis not present

## 2020-06-02 DIAGNOSIS — E782 Mixed hyperlipidemia: Secondary | ICD-10-CM | POA: Diagnosis not present

## 2020-06-02 DIAGNOSIS — E1169 Type 2 diabetes mellitus with other specified complication: Secondary | ICD-10-CM | POA: Diagnosis not present

## 2020-06-09 DIAGNOSIS — E782 Mixed hyperlipidemia: Secondary | ICD-10-CM | POA: Diagnosis not present

## 2020-06-09 DIAGNOSIS — I5022 Chronic systolic (congestive) heart failure: Secondary | ICD-10-CM | POA: Diagnosis not present

## 2020-06-09 DIAGNOSIS — E1169 Type 2 diabetes mellitus with other specified complication: Secondary | ICD-10-CM | POA: Diagnosis not present

## 2020-06-09 DIAGNOSIS — R053 Chronic cough: Secondary | ICD-10-CM | POA: Diagnosis not present

## 2020-06-09 DIAGNOSIS — I110 Hypertensive heart disease with heart failure: Secondary | ICD-10-CM | POA: Diagnosis not present

## 2020-07-07 DIAGNOSIS — H35373 Puckering of macula, bilateral: Secondary | ICD-10-CM | POA: Diagnosis not present

## 2020-07-07 DIAGNOSIS — Z0100 Encounter for examination of eyes and vision without abnormal findings: Secondary | ICD-10-CM | POA: Diagnosis not present

## 2020-07-14 DIAGNOSIS — H04123 Dry eye syndrome of bilateral lacrimal glands: Secondary | ICD-10-CM | POA: Diagnosis not present

## 2020-07-17 DIAGNOSIS — E782 Mixed hyperlipidemia: Secondary | ICD-10-CM | POA: Diagnosis not present

## 2020-07-17 DIAGNOSIS — Z95810 Presence of automatic (implantable) cardiac defibrillator: Secondary | ICD-10-CM | POA: Diagnosis not present

## 2020-07-17 DIAGNOSIS — I480 Paroxysmal atrial fibrillation: Secondary | ICD-10-CM | POA: Diagnosis not present

## 2020-07-17 DIAGNOSIS — I5022 Chronic systolic (congestive) heart failure: Secondary | ICD-10-CM | POA: Diagnosis not present

## 2020-07-17 DIAGNOSIS — E1169 Type 2 diabetes mellitus with other specified complication: Secondary | ICD-10-CM | POA: Diagnosis not present

## 2020-07-17 DIAGNOSIS — R002 Palpitations: Secondary | ICD-10-CM | POA: Diagnosis not present

## 2020-07-17 DIAGNOSIS — Z9889 Other specified postprocedural states: Secondary | ICD-10-CM | POA: Diagnosis not present

## 2020-07-17 DIAGNOSIS — E785 Hyperlipidemia, unspecified: Secondary | ICD-10-CM | POA: Diagnosis not present

## 2020-07-22 DIAGNOSIS — I5022 Chronic systolic (congestive) heart failure: Secondary | ICD-10-CM | POA: Diagnosis not present

## 2020-07-28 DIAGNOSIS — H04123 Dry eye syndrome of bilateral lacrimal glands: Secondary | ICD-10-CM | POA: Diagnosis not present

## 2020-11-04 DIAGNOSIS — L578 Other skin changes due to chronic exposure to nonionizing radiation: Secondary | ICD-10-CM | POA: Diagnosis not present

## 2020-11-04 DIAGNOSIS — Z85828 Personal history of other malignant neoplasm of skin: Secondary | ICD-10-CM | POA: Diagnosis not present

## 2020-11-04 DIAGNOSIS — Z872 Personal history of diseases of the skin and subcutaneous tissue: Secondary | ICD-10-CM | POA: Diagnosis not present

## 2020-11-04 DIAGNOSIS — L570 Actinic keratosis: Secondary | ICD-10-CM | POA: Diagnosis not present

## 2020-11-04 DIAGNOSIS — L720 Epidermal cyst: Secondary | ICD-10-CM | POA: Diagnosis not present

## 2020-12-04 DIAGNOSIS — E1169 Type 2 diabetes mellitus with other specified complication: Secondary | ICD-10-CM | POA: Diagnosis not present

## 2020-12-04 DIAGNOSIS — E785 Hyperlipidemia, unspecified: Secondary | ICD-10-CM | POA: Diagnosis not present

## 2020-12-04 DIAGNOSIS — I10 Essential (primary) hypertension: Secondary | ICD-10-CM | POA: Diagnosis not present

## 2020-12-04 DIAGNOSIS — E782 Mixed hyperlipidemia: Secondary | ICD-10-CM | POA: Diagnosis not present

## 2020-12-10 DIAGNOSIS — I5022 Chronic systolic (congestive) heart failure: Secondary | ICD-10-CM | POA: Diagnosis not present

## 2020-12-10 DIAGNOSIS — Z95810 Presence of automatic (implantable) cardiac defibrillator: Secondary | ICD-10-CM | POA: Diagnosis not present

## 2020-12-10 DIAGNOSIS — R002 Palpitations: Secondary | ICD-10-CM | POA: Diagnosis not present

## 2020-12-10 DIAGNOSIS — I10 Essential (primary) hypertension: Secondary | ICD-10-CM | POA: Diagnosis not present

## 2020-12-10 DIAGNOSIS — I2510 Atherosclerotic heart disease of native coronary artery without angina pectoris: Secondary | ICD-10-CM | POA: Diagnosis not present

## 2020-12-10 DIAGNOSIS — E782 Mixed hyperlipidemia: Secondary | ICD-10-CM | POA: Diagnosis not present

## 2020-12-10 DIAGNOSIS — G4733 Obstructive sleep apnea (adult) (pediatric): Secondary | ICD-10-CM | POA: Diagnosis not present

## 2020-12-11 DIAGNOSIS — I480 Paroxysmal atrial fibrillation: Secondary | ICD-10-CM | POA: Diagnosis not present

## 2020-12-11 DIAGNOSIS — I509 Heart failure, unspecified: Secondary | ICD-10-CM | POA: Diagnosis not present

## 2020-12-11 DIAGNOSIS — I110 Hypertensive heart disease with heart failure: Secondary | ICD-10-CM | POA: Diagnosis not present

## 2020-12-11 DIAGNOSIS — Z0000 Encounter for general adult medical examination without abnormal findings: Secondary | ICD-10-CM | POA: Diagnosis not present

## 2020-12-11 DIAGNOSIS — D696 Thrombocytopenia, unspecified: Secondary | ICD-10-CM | POA: Diagnosis not present

## 2020-12-11 DIAGNOSIS — R7303 Prediabetes: Secondary | ICD-10-CM | POA: Diagnosis not present

## 2020-12-11 DIAGNOSIS — E785 Hyperlipidemia, unspecified: Secondary | ICD-10-CM | POA: Diagnosis not present

## 2020-12-25 DIAGNOSIS — I480 Paroxysmal atrial fibrillation: Secondary | ICD-10-CM | POA: Diagnosis not present

## 2020-12-25 DIAGNOSIS — S39012A Strain of muscle, fascia and tendon of lower back, initial encounter: Secondary | ICD-10-CM | POA: Diagnosis not present

## 2020-12-25 DIAGNOSIS — I10 Essential (primary) hypertension: Secondary | ICD-10-CM | POA: Diagnosis not present

## 2021-01-27 DIAGNOSIS — I5022 Chronic systolic (congestive) heart failure: Secondary | ICD-10-CM | POA: Diagnosis not present

## 2021-01-30 DIAGNOSIS — R051 Acute cough: Secondary | ICD-10-CM | POA: Diagnosis not present

## 2021-01-30 DIAGNOSIS — U071 COVID-19: Secondary | ICD-10-CM | POA: Diagnosis not present

## 2021-01-30 DIAGNOSIS — Z20828 Contact with and (suspected) exposure to other viral communicable diseases: Secondary | ICD-10-CM | POA: Diagnosis not present

## 2021-04-07 DIAGNOSIS — E1169 Type 2 diabetes mellitus with other specified complication: Secondary | ICD-10-CM | POA: Diagnosis not present

## 2021-04-07 DIAGNOSIS — Z23 Encounter for immunization: Secondary | ICD-10-CM | POA: Diagnosis not present

## 2021-04-07 DIAGNOSIS — I480 Paroxysmal atrial fibrillation: Secondary | ICD-10-CM | POA: Diagnosis not present

## 2021-04-07 DIAGNOSIS — Z9889 Other specified postprocedural states: Secondary | ICD-10-CM | POA: Diagnosis not present

## 2021-04-07 DIAGNOSIS — I10 Essential (primary) hypertension: Secondary | ICD-10-CM | POA: Diagnosis not present

## 2021-04-07 DIAGNOSIS — I5022 Chronic systolic (congestive) heart failure: Secondary | ICD-10-CM | POA: Diagnosis not present

## 2021-04-07 DIAGNOSIS — I252 Old myocardial infarction: Secondary | ICD-10-CM | POA: Diagnosis not present

## 2021-04-07 DIAGNOSIS — Z95810 Presence of automatic (implantable) cardiac defibrillator: Secondary | ICD-10-CM | POA: Diagnosis not present

## 2021-04-07 DIAGNOSIS — E782 Mixed hyperlipidemia: Secondary | ICD-10-CM | POA: Diagnosis not present

## 2021-04-28 DIAGNOSIS — I480 Paroxysmal atrial fibrillation: Secondary | ICD-10-CM | POA: Diagnosis not present

## 2021-06-05 DIAGNOSIS — E782 Mixed hyperlipidemia: Secondary | ICD-10-CM | POA: Diagnosis not present

## 2021-06-05 DIAGNOSIS — E785 Hyperlipidemia, unspecified: Secondary | ICD-10-CM | POA: Diagnosis not present

## 2021-06-05 DIAGNOSIS — D696 Thrombocytopenia, unspecified: Secondary | ICD-10-CM | POA: Diagnosis not present

## 2021-06-05 DIAGNOSIS — E1169 Type 2 diabetes mellitus with other specified complication: Secondary | ICD-10-CM | POA: Diagnosis not present

## 2021-06-12 DIAGNOSIS — I110 Hypertensive heart disease with heart failure: Secondary | ICD-10-CM | POA: Diagnosis not present

## 2021-06-12 DIAGNOSIS — E1169 Type 2 diabetes mellitus with other specified complication: Secondary | ICD-10-CM | POA: Diagnosis not present

## 2021-06-12 DIAGNOSIS — I480 Paroxysmal atrial fibrillation: Secondary | ICD-10-CM | POA: Diagnosis not present

## 2021-06-12 DIAGNOSIS — I5022 Chronic systolic (congestive) heart failure: Secondary | ICD-10-CM | POA: Diagnosis not present

## 2021-06-12 DIAGNOSIS — E782 Mixed hyperlipidemia: Secondary | ICD-10-CM | POA: Diagnosis not present

## 2021-06-12 DIAGNOSIS — M25512 Pain in left shoulder: Secondary | ICD-10-CM | POA: Diagnosis not present

## 2021-06-12 DIAGNOSIS — G8929 Other chronic pain: Secondary | ICD-10-CM | POA: Diagnosis not present

## 2021-08-04 DIAGNOSIS — Z9889 Other specified postprocedural states: Secondary | ICD-10-CM | POA: Diagnosis not present

## 2021-08-04 DIAGNOSIS — I252 Old myocardial infarction: Secondary | ICD-10-CM | POA: Diagnosis not present

## 2021-08-04 DIAGNOSIS — I5022 Chronic systolic (congestive) heart failure: Secondary | ICD-10-CM | POA: Diagnosis not present

## 2021-08-04 DIAGNOSIS — G4733 Obstructive sleep apnea (adult) (pediatric): Secondary | ICD-10-CM | POA: Diagnosis not present

## 2021-08-04 DIAGNOSIS — I480 Paroxysmal atrial fibrillation: Secondary | ICD-10-CM | POA: Diagnosis not present

## 2021-08-04 DIAGNOSIS — I10 Essential (primary) hypertension: Secondary | ICD-10-CM | POA: Diagnosis not present

## 2021-08-04 DIAGNOSIS — Z66 Do not resuscitate: Secondary | ICD-10-CM | POA: Diagnosis not present

## 2021-08-04 DIAGNOSIS — I8391 Asymptomatic varicose veins of right lower extremity: Secondary | ICD-10-CM | POA: Diagnosis not present

## 2021-08-04 DIAGNOSIS — Z95810 Presence of automatic (implantable) cardiac defibrillator: Secondary | ICD-10-CM | POA: Diagnosis not present

## 2021-08-19 DIAGNOSIS — R0602 Shortness of breath: Secondary | ICD-10-CM | POA: Diagnosis not present

## 2021-08-19 DIAGNOSIS — I5022 Chronic systolic (congestive) heart failure: Secondary | ICD-10-CM | POA: Diagnosis not present

## 2021-09-01 DIAGNOSIS — H903 Sensorineural hearing loss, bilateral: Secondary | ICD-10-CM | POA: Diagnosis not present

## 2021-10-27 DIAGNOSIS — I480 Paroxysmal atrial fibrillation: Secondary | ICD-10-CM | POA: Diagnosis not present

## 2021-11-04 DIAGNOSIS — C44319 Basal cell carcinoma of skin of other parts of face: Secondary | ICD-10-CM | POA: Diagnosis not present

## 2021-11-04 DIAGNOSIS — Z85828 Personal history of other malignant neoplasm of skin: Secondary | ICD-10-CM | POA: Diagnosis not present

## 2021-11-04 DIAGNOSIS — L578 Other skin changes due to chronic exposure to nonionizing radiation: Secondary | ICD-10-CM | POA: Diagnosis not present

## 2021-11-04 DIAGNOSIS — L720 Epidermal cyst: Secondary | ICD-10-CM | POA: Diagnosis not present

## 2021-11-04 DIAGNOSIS — D485 Neoplasm of uncertain behavior of skin: Secondary | ICD-10-CM | POA: Diagnosis not present

## 2021-11-04 DIAGNOSIS — Z872 Personal history of diseases of the skin and subcutaneous tissue: Secondary | ICD-10-CM | POA: Diagnosis not present

## 2021-11-04 DIAGNOSIS — L570 Actinic keratosis: Secondary | ICD-10-CM | POA: Diagnosis not present

## 2021-12-09 DIAGNOSIS — E782 Mixed hyperlipidemia: Secondary | ICD-10-CM | POA: Diagnosis not present

## 2021-12-09 DIAGNOSIS — E1169 Type 2 diabetes mellitus with other specified complication: Secondary | ICD-10-CM | POA: Diagnosis not present

## 2021-12-09 DIAGNOSIS — E785 Hyperlipidemia, unspecified: Secondary | ICD-10-CM | POA: Diagnosis not present

## 2021-12-09 DIAGNOSIS — I10 Essential (primary) hypertension: Secondary | ICD-10-CM | POA: Diagnosis not present

## 2021-12-10 DIAGNOSIS — C4491 Basal cell carcinoma of skin, unspecified: Secondary | ICD-10-CM | POA: Diagnosis not present

## 2021-12-10 DIAGNOSIS — L988 Other specified disorders of the skin and subcutaneous tissue: Secondary | ICD-10-CM | POA: Diagnosis not present

## 2021-12-16 DIAGNOSIS — E119 Type 2 diabetes mellitus without complications: Secondary | ICD-10-CM | POA: Diagnosis not present

## 2021-12-16 DIAGNOSIS — I2510 Atherosclerotic heart disease of native coronary artery without angina pectoris: Secondary | ICD-10-CM | POA: Diagnosis not present

## 2021-12-16 DIAGNOSIS — Z0000 Encounter for general adult medical examination without abnormal findings: Secondary | ICD-10-CM | POA: Diagnosis not present

## 2021-12-16 DIAGNOSIS — I8391 Asymptomatic varicose veins of right lower extremity: Secondary | ICD-10-CM | POA: Diagnosis not present

## 2021-12-16 DIAGNOSIS — Z1389 Encounter for screening for other disorder: Secondary | ICD-10-CM | POA: Diagnosis not present

## 2021-12-16 DIAGNOSIS — Z9889 Other specified postprocedural states: Secondary | ICD-10-CM | POA: Diagnosis not present

## 2021-12-16 DIAGNOSIS — I5022 Chronic systolic (congestive) heart failure: Secondary | ICD-10-CM | POA: Diagnosis not present

## 2021-12-16 DIAGNOSIS — I252 Old myocardial infarction: Secondary | ICD-10-CM | POA: Diagnosis not present

## 2021-12-16 DIAGNOSIS — I10 Essential (primary) hypertension: Secondary | ICD-10-CM | POA: Diagnosis not present

## 2021-12-16 DIAGNOSIS — Z95810 Presence of automatic (implantable) cardiac defibrillator: Secondary | ICD-10-CM | POA: Diagnosis not present

## 2021-12-16 DIAGNOSIS — E785 Hyperlipidemia, unspecified: Secondary | ICD-10-CM | POA: Diagnosis not present

## 2021-12-16 DIAGNOSIS — I480 Paroxysmal atrial fibrillation: Secondary | ICD-10-CM | POA: Diagnosis not present

## 2021-12-16 DIAGNOSIS — G4733 Obstructive sleep apnea (adult) (pediatric): Secondary | ICD-10-CM | POA: Diagnosis not present

## 2021-12-21 DIAGNOSIS — L910 Hypertrophic scar: Secondary | ICD-10-CM | POA: Diagnosis not present

## 2021-12-21 DIAGNOSIS — L720 Epidermal cyst: Secondary | ICD-10-CM | POA: Diagnosis not present

## 2021-12-21 DIAGNOSIS — L905 Scar conditions and fibrosis of skin: Secondary | ICD-10-CM | POA: Diagnosis not present

## 2022-01-13 DIAGNOSIS — I480 Paroxysmal atrial fibrillation: Secondary | ICD-10-CM | POA: Diagnosis not present

## 2022-02-22 DIAGNOSIS — H52223 Regular astigmatism, bilateral: Secondary | ICD-10-CM | POA: Diagnosis not present

## 2022-02-22 DIAGNOSIS — H04123 Dry eye syndrome of bilateral lacrimal glands: Secondary | ICD-10-CM | POA: Diagnosis not present

## 2022-03-11 DIAGNOSIS — E782 Mixed hyperlipidemia: Secondary | ICD-10-CM | POA: Diagnosis not present

## 2022-03-11 DIAGNOSIS — E785 Hyperlipidemia, unspecified: Secondary | ICD-10-CM | POA: Diagnosis not present

## 2022-03-11 DIAGNOSIS — I10 Essential (primary) hypertension: Secondary | ICD-10-CM | POA: Diagnosis not present

## 2022-03-11 DIAGNOSIS — E1169 Type 2 diabetes mellitus with other specified complication: Secondary | ICD-10-CM | POA: Diagnosis not present

## 2022-03-12 DIAGNOSIS — B9689 Other specified bacterial agents as the cause of diseases classified elsewhere: Secondary | ICD-10-CM | POA: Diagnosis not present

## 2022-03-12 DIAGNOSIS — R059 Cough, unspecified: Secondary | ICD-10-CM | POA: Diagnosis not present

## 2022-03-12 DIAGNOSIS — Z03818 Encounter for observation for suspected exposure to other biological agents ruled out: Secondary | ICD-10-CM | POA: Diagnosis not present

## 2022-03-12 DIAGNOSIS — J019 Acute sinusitis, unspecified: Secondary | ICD-10-CM | POA: Diagnosis not present

## 2022-03-12 DIAGNOSIS — R051 Acute cough: Secondary | ICD-10-CM | POA: Diagnosis not present

## 2022-03-12 DIAGNOSIS — R509 Fever, unspecified: Secondary | ICD-10-CM | POA: Diagnosis not present

## 2022-03-12 DIAGNOSIS — J209 Acute bronchitis, unspecified: Secondary | ICD-10-CM | POA: Diagnosis not present

## 2022-03-18 DIAGNOSIS — E1169 Type 2 diabetes mellitus with other specified complication: Secondary | ICD-10-CM | POA: Diagnosis not present

## 2022-03-18 DIAGNOSIS — I10 Essential (primary) hypertension: Secondary | ICD-10-CM | POA: Diagnosis not present

## 2022-03-18 DIAGNOSIS — E782 Mixed hyperlipidemia: Secondary | ICD-10-CM | POA: Diagnosis not present

## 2022-03-18 DIAGNOSIS — J209 Acute bronchitis, unspecified: Secondary | ICD-10-CM | POA: Diagnosis not present

## 2022-03-24 DIAGNOSIS — M25551 Pain in right hip: Secondary | ICD-10-CM | POA: Diagnosis not present

## 2022-04-13 DIAGNOSIS — R059 Cough, unspecified: Secondary | ICD-10-CM | POA: Diagnosis not present

## 2022-04-13 DIAGNOSIS — I700 Atherosclerosis of aorta: Secondary | ICD-10-CM | POA: Diagnosis not present

## 2022-04-13 DIAGNOSIS — R058 Other specified cough: Secondary | ICD-10-CM | POA: Diagnosis not present

## 2022-04-20 DIAGNOSIS — I480 Paroxysmal atrial fibrillation: Secondary | ICD-10-CM | POA: Diagnosis not present

## 2022-04-20 DIAGNOSIS — I5022 Chronic systolic (congestive) heart failure: Secondary | ICD-10-CM | POA: Diagnosis not present

## 2022-04-20 DIAGNOSIS — E782 Mixed hyperlipidemia: Secondary | ICD-10-CM | POA: Diagnosis not present

## 2022-04-20 DIAGNOSIS — Z23 Encounter for immunization: Secondary | ICD-10-CM | POA: Diagnosis not present

## 2022-04-20 DIAGNOSIS — Z95810 Presence of automatic (implantable) cardiac defibrillator: Secondary | ICD-10-CM | POA: Diagnosis not present

## 2022-04-20 DIAGNOSIS — R0602 Shortness of breath: Secondary | ICD-10-CM | POA: Diagnosis not present

## 2022-04-20 DIAGNOSIS — I10 Essential (primary) hypertension: Secondary | ICD-10-CM | POA: Diagnosis not present

## 2022-04-23 DIAGNOSIS — R21 Rash and other nonspecific skin eruption: Secondary | ICD-10-CM | POA: Diagnosis not present

## 2022-04-23 DIAGNOSIS — E1169 Type 2 diabetes mellitus with other specified complication: Secondary | ICD-10-CM | POA: Diagnosis not present

## 2022-04-23 DIAGNOSIS — E785 Hyperlipidemia, unspecified: Secondary | ICD-10-CM | POA: Diagnosis not present

## 2022-04-23 DIAGNOSIS — R058 Other specified cough: Secondary | ICD-10-CM | POA: Diagnosis not present

## 2022-04-28 DIAGNOSIS — Z0100 Encounter for examination of eyes and vision without abnormal findings: Secondary | ICD-10-CM | POA: Diagnosis not present

## 2022-06-08 DIAGNOSIS — I5022 Chronic systolic (congestive) heart failure: Secondary | ICD-10-CM | POA: Diagnosis not present

## 2022-06-08 DIAGNOSIS — Z9889 Other specified postprocedural states: Secondary | ICD-10-CM | POA: Diagnosis not present

## 2022-06-08 DIAGNOSIS — I480 Paroxysmal atrial fibrillation: Secondary | ICD-10-CM | POA: Diagnosis not present

## 2022-06-08 DIAGNOSIS — R0602 Shortness of breath: Secondary | ICD-10-CM | POA: Diagnosis not present

## 2022-06-08 DIAGNOSIS — I2510 Atherosclerotic heart disease of native coronary artery without angina pectoris: Secondary | ICD-10-CM | POA: Diagnosis not present

## 2022-06-08 DIAGNOSIS — Z95810 Presence of automatic (implantable) cardiac defibrillator: Secondary | ICD-10-CM | POA: Diagnosis not present

## 2022-06-08 DIAGNOSIS — E1169 Type 2 diabetes mellitus with other specified complication: Secondary | ICD-10-CM | POA: Diagnosis not present

## 2022-06-08 DIAGNOSIS — I10 Essential (primary) hypertension: Secondary | ICD-10-CM | POA: Diagnosis not present

## 2022-06-08 DIAGNOSIS — E782 Mixed hyperlipidemia: Secondary | ICD-10-CM | POA: Diagnosis not present

## 2022-07-20 DIAGNOSIS — I5022 Chronic systolic (congestive) heart failure: Secondary | ICD-10-CM | POA: Diagnosis not present

## 2022-07-25 ENCOUNTER — Ambulatory Visit (INDEPENDENT_AMBULATORY_CARE_PROVIDER_SITE_OTHER): Payer: Medicare HMO

## 2022-07-25 ENCOUNTER — Encounter: Payer: Self-pay | Admitting: Emergency Medicine

## 2022-07-25 ENCOUNTER — Ambulatory Visit
Admission: EM | Admit: 2022-07-25 | Discharge: 2022-07-25 | Disposition: A | Discharge status: Home / Self Care | Payer: Medicare HMO | Attending: Physician Assistant | Admitting: Physician Assistant

## 2022-07-25 DIAGNOSIS — R0602 Shortness of breath: Secondary | ICD-10-CM

## 2022-07-25 DIAGNOSIS — J069 Acute upper respiratory infection, unspecified: Secondary | ICD-10-CM | POA: Diagnosis not present

## 2022-07-25 DIAGNOSIS — R059 Cough, unspecified: Secondary | ICD-10-CM

## 2022-07-25 DIAGNOSIS — Z1152 Encounter for screening for COVID-19: Secondary | ICD-10-CM | POA: Insufficient documentation

## 2022-07-25 LAB — SARS CORONAVIRUS 2 BY RT PCR: SARS Coronavirus 2 by RT PCR: NEGATIVE

## 2022-07-25 MED ORDER — BENZONATATE 100 MG PO CAPS: 100.0000 mg | ORAL_CAPSULE | Freq: Three times a day (TID) | ORAL | 0 refills | Status: DC

## 2022-07-25 MED ORDER — ALBUTEROL SULFATE HFA 108 (90 BASE) MCG/ACT IN AERS
2.0000 | INHALATION_SPRAY | Freq: Once | RESPIRATORY_TRACT | Status: AC
  Administered 2022-07-25: 2 via RESPIRATORY_TRACT

## 2022-07-25 MED ORDER — AEROCHAMBER PLUS FLO-VU LARGE MISC
1.0000 | Freq: Once | Status: AC
  Administered 2022-07-25: 1

## 2022-07-25 NOTE — Discharge Instructions (Signed)
Your x-ray was normal with no evidence of pneumonia.  You tested negative for COVID.  I am concerned that you have a virus versus allergies causing your symptoms.  Continue over-the-counter medications including antihistamines.  I have called in Tessalon to help with your cough.  Use albuterol every 4-6 hours as needed for shortness of breath and coughing fits.  If you are having to use this frequently please return for reevaluation.  If your symptoms are improving by next week please return for reevaluation.  If anything worsens be seen immediately.

## 2022-07-25 NOTE — ED Triage Notes (Signed)
Patient c/o cough and chest congestion since Friday.  Patient reports history of Bronchitis.  Patient denies fevers.

## 2022-07-25 NOTE — ED Provider Notes (Signed)
[facility]    CSN: [id number] Arrival date & time: 07/25/22  0906      History   Chief Complaint Chief Complaint  Patient presents with   Cough    HPI Jay [name] is a 72 y.o. male.   Patient presents today with a 3 to 4-day history of URI symptoms.  He reports nasal congestion, cough, mild shortness of breath.  He denies any fever, nausea, vomiting, diarrhea.  He denies history of chronic lung condition including asthma, COPD, smoking.  He denies any known sick contacts.  He reports that in December he had similar symptoms and required antibiotics for resolution of symptoms.  He denies any recent antibiotics or steroids in the past 90 days.  He does have a history of diabetes but his last A1c obtained 03/11/2022 was appropriate at 6.3%.  He does report occasional seasonal allergies and has been taking over-the-counter antihistamines without improvement of symptoms.  He has not been taking any additional over-the-counter medication given his history of cardiovascular disease due to concern for interaction with current medication regimen.  He has had COVID several years ago.      Past Medical History:  Diagnosis Date   AICD (automatic cardioverter/defibrillator) present    05/08/15   Cardiomyopathy    NONISCHEMIC DILATED   CHF (congestive heart failure)    CHRONIC   Complication of anesthesia    woke up doing surgeries   Coronary artery disease    Dyspnea    CHRONIC DOE   Dysrhythmia    SUSTAINED VT/BRADYCARDIA/PAROXYSMAL A FIB   Hypertension    MI (myocardial infarction)    Presence of permanent cardiac pacemaker    Sleep apnea    no cpap    Patient Active Problem List   Diagnosis Date Noted   Primary localized osteoarthritis of left hip 02/10/2016   V-tach 05/04/2015   CAD (coronary artery disease) 05/04/2015   HTN (hypertension) 05/04/2015   Chronic systolic CHF (congestive heart failure) 05/04/2015   Nonischemic dilated cardiomyopathy  05/04/2015    Past Surgical History:  Procedure Laterality Date   ABLATION     FOR V/T 08/01/15   CARDIAC CATHETERIZATION N/A 05/06/2015   Procedure: Left Heart Cath and Coronary Angiography;  Surgeon: Marcina Millard, MD;  Location: [facility];  Service: Cardiovascular;  Laterality: N/A;   CATARACT EXTRACTION     COLONOSCOPY WITH PROPOFOL N/A 09/23/2017   Procedure: COLONOSCOPY WITH PROPOFOL;  Surgeon: Scot Jun, MD;  Location: [facility];  Service: Endoscopy;  Laterality: N/A;   CORONARY ARTERY BYPASS GRAFT     EYE SURGERY     FRACTURE SURGERY     INSERT / REPLACE / REMOVE PACEMAKER     JOINT REPLACEMENT     RETINAL DETACHMENT SURGERY     TOTAL HIP ARTHROPLASTY Left 02/10/2016   Procedure: TOTAL HIP ARTHROPLASTY ANTERIOR APPROACH;  Surgeon: Kennedy Bucker, MD;  Location: [facility] OR[facility];  Service: Orthopedics;  Laterality: Left;       Home Medications    Prior to Admission medications   Medication Sig Start Date End Date Taking? Authorizing Provider  benzonatate (TESSALON) 100 MG capsule Take 1 capsule (100 mg total) by mouth every 8 (eight) hours. 07/25/22  Yes Agatha Duplechain, Denny Peon K, PA-C  apixaban (ELIQUIS) 5 MG TABS tablet Take 5 mg by mouth 2 (two) times daily.    [provider]  Cholecalciferol (VITAMIN D) 2000 units tablet Take 2,000 Units by mouth daily.    [provider]  Cyanocobalamin (RA VITAMIN B-12 TR) 1000 MCG TBCR Take 1,000 mcg by mouth every morning.     [provider]  fluticasone (FLONASE) 50 MCG/ACT nasal spray Place 2 sprays into the nose daily as needed for allergies.  01/08/14   [provider]  lisinopril (PRINIVIL,ZESTRIL) 5 MG tablet Take 5 mg by mouth every morning. 04/20/15   [provider]  lovastatin (MEVACOR) 40 MG tablet Take 40 mg by mouth daily after supper.  05/02/15   [provider]  Multiple Vitamins-Minerals (CENTRUM SILVER ULTRA MENS) TABS Take 1 tablet by mouth every morning.     [provider]  oxyCODONE (OXY IR/ROXICODONE) 5 MG immediate release tablet Take 1-2 tablets (5-10 mg total) by mouth every 3 (three) hours as needed for breakthrough pain. 02/12/16   Evon Slack, PA-C  sotalol (BETAPACE) 80 MG tablet Take 80 mg by mouth 2 (two) times daily.    [provider]  vitamin C (ASCORBIC ACID) 500 MG tablet Take 500 mg by mouth daily.    [provider]  vitamin E 400 UNIT capsule Take 400 Units by mouth every morning.  06/24/10   [provider]    Family History Family History  Problem Relation Age of Onset   Stomach cancer Mother    Heart disease Father     Social History Social History   Tobacco Use   Smoking status: Never   Smokeless tobacco: Current    Types: Chew  Vaping Use   Vaping Use: Never used  Substance Use Topics   Alcohol use: No   Drug use: No     Allergies   Pollen extract   Review of Systems Review of Systems  Constitutional:  Positive for activity change. Negative for appetite change, fatigue and fever.  HENT:  Positive for congestion. Negative for sinus pressure, sneezing and sore throat.   Respiratory:  Positive for cough and shortness of breath.   Cardiovascular:  Negative for chest pain.  Gastrointestinal:  Negative for abdominal pain, diarrhea, nausea and vomiting.     Physical Exam Triage Vital Signs ED Triage Vitals  Enc Vitals Group     BP 07/25/22 0934 115/81     Pulse Rate 07/25/22 0934 89     Resp 07/25/22 0934 15     Temp 07/25/22 0934 98.2 F (36.8 C)     Temp Source 07/25/22 0934 Oral     SpO2 07/25/22 0934 97 %     Weight 07/25/22 0931 195 lb (88.5 kg)     Height 07/25/22 0931  (1.88 m)     Head Circumference --      Peak Flow --      Pain Score 07/25/22 0931 0     Pain Loc --      Pain Edu? --      Excl. in GC? --    No data found.  Updated Vital Signs BP 115/81 (BP Location: Left Arm)   Pulse 89   Temp 98.2 F (36.8 C) (Oral)   Resp 15    Ht  (1.88 m)   Wt 195 lb (88.5 kg)   SpO2 97%   BMI 25.04 kg/m   Visual Acuity Right Eye Distance:   Left Eye Distance:   Bilateral Distance:    Right Eye Near:   Left Eye Near:    Bilateral Near:     Physical Exam Vitals reviewed.  Constitutional:      General:  He is awake.     Appearance: Normal appearance. He is well-developed. He is not ill-appearing.     Comments: Very pleasant male appears stated age in no acute distress sitting comfortably in exam room  HENT:     Head: Normocephalic and atraumatic.     Right Ear: Tympanic membrane, ear canal and external ear normal. Tympanic membrane is not erythematous or bulging.     Left Ear: Tympanic membrane, ear canal and external ear normal. Tympanic membrane is not erythematous or bulging.     Nose: Nose normal.     Mouth/Throat:     Dentition: Abnormal dentition.     Pharynx: Uvula midline. Posterior oropharyngeal erythema present. No oropharyngeal exudate.  Cardiovascular:     Rate and Rhythm: Normal rate and regular rhythm.     Heart sounds: Normal heart sounds, S1 normal and S2 normal. No murmur heard. Pulmonary:     Effort: Pulmonary effort is normal. No accessory muscle usage or respiratory distress.     Breath sounds: No stridor. Examination of the right-lower field reveals wheezing. Examination of the left-lower field reveals wheezing. Wheezing present. No rhonchi or rales.     Comments: Reactive cough with deep breathing Neurological:     Mental Status: He is alert.  Psychiatric:        Behavior: Behavior is cooperative.      UC Treatments / Results  Labs (all labs ordered are listed, but only abnormal results are displayed) Labs Reviewed  SARS CORONAVIRUS 2 BY RT PCR    EKG   Radiology DG Chest 2 View  Result Date: 07/25/2022 CLINICAL DATA:  Cough, shortness of breath EXAM: CHEST - 2 VIEW COMPARISON:  05/04/2015 FINDINGS: Left-sided implanted cardiac device with leads terminating within the right  atrium and right ventricle. Heart size is within normal limits. Aortic atherosclerosis. No focal airspace consolidation, pleural effusion, or pneumothorax. IMPRESSION: No active cardiopulmonary disease. Electronically Signed   By: Duanne Guess D.O.   On: 07/25/2022 10:14    Procedures Procedures (including critical care time)  Medications Ordered in UC Medications  albuterol (VENTOLIN HFA) 108 (90 Base) MCG/ACT inhaler 2 puff (2 puffs Inhalation Given 07/25/22 1009)  AeroChamber Plus Flo-Vu Large MISC 1 each (1 each Other Given 07/25/22 1009)    Initial Impression / Assessment and Plan / UC Course  I have reviewed the triage vital signs and the nursing notes.  Pertinent labs & imaging results that were available during my care of the patient were reviewed by me and considered in my medical decision making (see chart for details).     Patient is well-appearing, afebrile, nontoxic, nontachycardic.  COVID testing was obtained and was negative.  Chest x-ray was obtained and was normal with no evidence of acute cardiopulmonary disease.  He was given a couple puffs of albuterol inhaler with improvement of symptoms.  He was sent home with this medication with instruction to use this every 6 hours as needed to help manage his symptoms.  Discussed that symptoms are likely caused by viral illness versus allergies and was encouraged to use over-the-counter medication including antihistamines as well as nasal saline/sinus rinses to manage symptoms.  Can use humidifier to help with his cough.  He was given Tessalon for additional symptom relief.  Recommend he continue Tylenol and Mucinex for symptom relief.  Discussed that if symptoms or not improving by next week he should return here or see his primary care.  If anything worsens and he has worsening cough,  shortness of breath, weakness, nausea/vomiting interfering with oral intake he needs to be seen immediately.  Strict return precautions given.  All  questions answered to patient satisfaction.  Final Clinical Impressions(s) / UC Diagnoses   Final diagnoses:  Upper respiratory tract infection, unspecified type     Discharge Instructions      Your x-ray was normal with no evidence of pneumonia.  You tested negative for COVID.  I am concerned that you have a virus versus allergies causing your symptoms.  Continue over-the-counter medications including antihistamines.  I have called in Tessalon to help with your cough.  Use albuterol every 4-6 hours as needed for shortness of breath and coughing fits.  If you are having to use this frequently please return for reevaluation.  If your symptoms are improving by next week please return for reevaluation.  If anything worsens be seen immediately.     ED Prescriptions     Medication Sig Dispense Auth. Provider   benzonatate (TESSALON) 100 MG capsule Take 1 capsule (100 mg total) by mouth every 8 (eight) hours. 21 capsule Chelle Cayton K, PA-C      PDMP not reviewed this encounter.   Jeani Hawking, PA-C 07/25/22 1030

## 2022-09-02 DIAGNOSIS — I420 Dilated cardiomyopathy: Secondary | ICD-10-CM | POA: Diagnosis not present

## 2022-09-02 DIAGNOSIS — E782 Mixed hyperlipidemia: Secondary | ICD-10-CM | POA: Diagnosis not present

## 2022-09-02 DIAGNOSIS — I5022 Chronic systolic (congestive) heart failure: Secondary | ICD-10-CM | POA: Diagnosis not present

## 2022-09-02 DIAGNOSIS — Z95810 Presence of automatic (implantable) cardiac defibrillator: Secondary | ICD-10-CM | POA: Diagnosis not present

## 2022-09-02 DIAGNOSIS — I10 Essential (primary) hypertension: Secondary | ICD-10-CM | POA: Diagnosis not present

## 2022-09-02 DIAGNOSIS — I480 Paroxysmal atrial fibrillation: Secondary | ICD-10-CM | POA: Diagnosis not present

## 2022-09-14 DIAGNOSIS — I480 Paroxysmal atrial fibrillation: Secondary | ICD-10-CM | POA: Diagnosis not present

## 2022-09-16 DIAGNOSIS — E785 Hyperlipidemia, unspecified: Secondary | ICD-10-CM | POA: Diagnosis not present

## 2022-09-16 DIAGNOSIS — E782 Mixed hyperlipidemia: Secondary | ICD-10-CM | POA: Diagnosis not present

## 2022-09-16 DIAGNOSIS — E1169 Type 2 diabetes mellitus with other specified complication: Secondary | ICD-10-CM | POA: Diagnosis not present

## 2022-09-23 DIAGNOSIS — I10 Essential (primary) hypertension: Secondary | ICD-10-CM | POA: Diagnosis not present

## 2022-09-23 DIAGNOSIS — Z86010 Personal history of colonic polyps: Secondary | ICD-10-CM | POA: Diagnosis not present

## 2022-09-23 DIAGNOSIS — E1169 Type 2 diabetes mellitus with other specified complication: Secondary | ICD-10-CM | POA: Diagnosis not present

## 2022-09-23 DIAGNOSIS — E782 Mixed hyperlipidemia: Secondary | ICD-10-CM | POA: Diagnosis not present

## 2022-09-23 DIAGNOSIS — Z87898 Personal history of other specified conditions: Secondary | ICD-10-CM | POA: Diagnosis not present

## 2022-11-04 DIAGNOSIS — Z872 Personal history of diseases of the skin and subcutaneous tissue: Secondary | ICD-10-CM | POA: Diagnosis not present

## 2022-11-04 DIAGNOSIS — L570 Actinic keratosis: Secondary | ICD-10-CM | POA: Diagnosis not present

## 2022-11-04 DIAGNOSIS — L910 Hypertrophic scar: Secondary | ICD-10-CM | POA: Diagnosis not present

## 2022-11-04 DIAGNOSIS — Z85828 Personal history of other malignant neoplasm of skin: Secondary | ICD-10-CM | POA: Diagnosis not present

## 2022-11-04 DIAGNOSIS — L720 Epidermal cyst: Secondary | ICD-10-CM | POA: Diagnosis not present

## 2022-11-04 DIAGNOSIS — L578 Other skin changes due to chronic exposure to nonionizing radiation: Secondary | ICD-10-CM | POA: Diagnosis not present

## 2022-11-10 DIAGNOSIS — I4901 Ventricular fibrillation: Secondary | ICD-10-CM | POA: Diagnosis not present

## 2022-11-10 DIAGNOSIS — I472 Ventricular tachycardia, unspecified: Secondary | ICD-10-CM | POA: Diagnosis not present

## 2022-11-10 DIAGNOSIS — I252 Old myocardial infarction: Secondary | ICD-10-CM | POA: Diagnosis not present

## 2022-11-10 DIAGNOSIS — I10 Essential (primary) hypertension: Secondary | ICD-10-CM | POA: Diagnosis not present

## 2022-11-10 DIAGNOSIS — Z95810 Presence of automatic (implantable) cardiac defibrillator: Secondary | ICD-10-CM | POA: Diagnosis not present

## 2022-11-10 DIAGNOSIS — E782 Mixed hyperlipidemia: Secondary | ICD-10-CM | POA: Diagnosis not present

## 2022-11-10 DIAGNOSIS — I2510 Atherosclerotic heart disease of native coronary artery without angina pectoris: Secondary | ICD-10-CM | POA: Diagnosis not present

## 2022-11-10 DIAGNOSIS — I480 Paroxysmal atrial fibrillation: Secondary | ICD-10-CM | POA: Diagnosis not present

## 2022-11-10 DIAGNOSIS — I5022 Chronic systolic (congestive) heart failure: Secondary | ICD-10-CM | POA: Diagnosis not present

## 2022-11-24 DIAGNOSIS — I480 Paroxysmal atrial fibrillation: Secondary | ICD-10-CM | POA: Diagnosis not present

## 2022-11-24 DIAGNOSIS — I472 Ventricular tachycardia, unspecified: Secondary | ICD-10-CM | POA: Diagnosis not present

## 2022-11-24 DIAGNOSIS — Z95810 Presence of automatic (implantable) cardiac defibrillator: Secondary | ICD-10-CM | POA: Diagnosis not present

## 2022-12-23 DIAGNOSIS — I480 Paroxysmal atrial fibrillation: Secondary | ICD-10-CM | POA: Diagnosis not present

## 2023-01-03 DIAGNOSIS — Z95810 Presence of automatic (implantable) cardiac defibrillator: Secondary | ICD-10-CM | POA: Diagnosis not present

## 2023-01-03 DIAGNOSIS — I5022 Chronic systolic (congestive) heart failure: Secondary | ICD-10-CM | POA: Diagnosis not present

## 2023-01-03 DIAGNOSIS — R0602 Shortness of breath: Secondary | ICD-10-CM | POA: Diagnosis not present

## 2023-01-03 DIAGNOSIS — I480 Paroxysmal atrial fibrillation: Secondary | ICD-10-CM | POA: Diagnosis not present

## 2023-01-03 DIAGNOSIS — Z23 Encounter for immunization: Secondary | ICD-10-CM | POA: Diagnosis not present

## 2023-01-03 DIAGNOSIS — E782 Mixed hyperlipidemia: Secondary | ICD-10-CM | POA: Diagnosis not present

## 2023-01-03 DIAGNOSIS — I10 Essential (primary) hypertension: Secondary | ICD-10-CM | POA: Diagnosis not present

## 2023-01-03 DIAGNOSIS — I493 Ventricular premature depolarization: Secondary | ICD-10-CM | POA: Diagnosis not present

## 2023-01-17 DIAGNOSIS — I420 Dilated cardiomyopathy: Secondary | ICD-10-CM | POA: Diagnosis not present

## 2023-01-17 DIAGNOSIS — I4729 Other ventricular tachycardia: Secondary | ICD-10-CM | POA: Diagnosis not present

## 2023-01-24 DIAGNOSIS — I5022 Chronic systolic (congestive) heart failure: Secondary | ICD-10-CM | POA: Diagnosis not present

## 2023-01-24 DIAGNOSIS — Z9889 Other specified postprocedural states: Secondary | ICD-10-CM | POA: Diagnosis not present

## 2023-01-24 DIAGNOSIS — E782 Mixed hyperlipidemia: Secondary | ICD-10-CM | POA: Diagnosis not present

## 2023-01-24 DIAGNOSIS — R0602 Shortness of breath: Secondary | ICD-10-CM | POA: Diagnosis not present

## 2023-01-24 DIAGNOSIS — Z23 Encounter for immunization: Secondary | ICD-10-CM | POA: Diagnosis not present

## 2023-01-24 DIAGNOSIS — I2510 Atherosclerotic heart disease of native coronary artery without angina pectoris: Secondary | ICD-10-CM | POA: Diagnosis not present

## 2023-01-24 DIAGNOSIS — I10 Essential (primary) hypertension: Secondary | ICD-10-CM | POA: Diagnosis not present

## 2023-01-24 DIAGNOSIS — I480 Paroxysmal atrial fibrillation: Secondary | ICD-10-CM | POA: Diagnosis not present

## 2023-01-24 DIAGNOSIS — Z95810 Presence of automatic (implantable) cardiac defibrillator: Secondary | ICD-10-CM | POA: Diagnosis not present

## 2023-02-01 DIAGNOSIS — I5022 Chronic systolic (congestive) heart failure: Secondary | ICD-10-CM | POA: Diagnosis not present

## 2023-02-08 DIAGNOSIS — Z860100 Personal history of colon polyps, unspecified: Secondary | ICD-10-CM | POA: Diagnosis not present

## 2023-02-08 DIAGNOSIS — Z7901 Long term (current) use of anticoagulants: Secondary | ICD-10-CM | POA: Diagnosis not present

## 2023-02-09 DIAGNOSIS — I5022 Chronic systolic (congestive) heart failure: Secondary | ICD-10-CM | POA: Diagnosis not present

## 2023-02-09 DIAGNOSIS — I10 Essential (primary) hypertension: Secondary | ICD-10-CM | POA: Diagnosis not present

## 2023-02-09 DIAGNOSIS — E1169 Type 2 diabetes mellitus with other specified complication: Secondary | ICD-10-CM | POA: Diagnosis not present

## 2023-02-09 DIAGNOSIS — R0602 Shortness of breath: Secondary | ICD-10-CM | POA: Diagnosis not present

## 2023-02-09 DIAGNOSIS — G4733 Obstructive sleep apnea (adult) (pediatric): Secondary | ICD-10-CM | POA: Diagnosis not present

## 2023-02-09 DIAGNOSIS — I480 Paroxysmal atrial fibrillation: Secondary | ICD-10-CM | POA: Diagnosis not present

## 2023-02-09 DIAGNOSIS — Z9889 Other specified postprocedural states: Secondary | ICD-10-CM | POA: Diagnosis not present

## 2023-02-09 DIAGNOSIS — I2510 Atherosclerotic heart disease of native coronary artery without angina pectoris: Secondary | ICD-10-CM | POA: Diagnosis not present

## 2023-02-09 DIAGNOSIS — Z95810 Presence of automatic (implantable) cardiac defibrillator: Secondary | ICD-10-CM | POA: Diagnosis not present

## 2023-02-11 ENCOUNTER — Encounter: Payer: Self-pay | Admitting: Cardiology

## 2023-02-11 ENCOUNTER — Other Ambulatory Visit: Payer: Self-pay

## 2023-02-11 ENCOUNTER — Ambulatory Visit
Admission: RE | Admit: 2023-02-11 | Discharge: 2023-02-11 | Disposition: A | Discharge status: Home / Self Care | Payer: Medicare HMO | Attending: Cardiology | Admitting: Cardiology

## 2023-02-11 ENCOUNTER — Ambulatory Visit: Payer: Medicare HMO | Admitting: Anesthesiology

## 2023-02-11 ENCOUNTER — Encounter
Admission: RE | Disposition: A | Discharge status: Home / Self Care | Payer: Self-pay | Source: Home / Self Care | Attending: Cardiology

## 2023-02-11 DIAGNOSIS — I480 Paroxysmal atrial fibrillation: Secondary | ICD-10-CM | POA: Insufficient documentation

## 2023-02-11 DIAGNOSIS — Z79899 Other long term (current) drug therapy: Secondary | ICD-10-CM | POA: Insufficient documentation

## 2023-02-11 DIAGNOSIS — I4892 Unspecified atrial flutter: Secondary | ICD-10-CM | POA: Diagnosis not present

## 2023-02-11 DIAGNOSIS — I2510 Atherosclerotic heart disease of native coronary artery without angina pectoris: Secondary | ICD-10-CM | POA: Diagnosis not present

## 2023-02-11 DIAGNOSIS — I4819 Other persistent atrial fibrillation: Secondary | ICD-10-CM

## 2023-02-11 DIAGNOSIS — I252 Old myocardial infarction: Secondary | ICD-10-CM | POA: Insufficient documentation

## 2023-02-11 DIAGNOSIS — Z7722 Contact with and (suspected) exposure to environmental tobacco smoke (acute) (chronic): Secondary | ICD-10-CM | POA: Diagnosis not present

## 2023-02-11 DIAGNOSIS — Z7901 Long term (current) use of anticoagulants: Secondary | ICD-10-CM | POA: Diagnosis not present

## 2023-02-11 DIAGNOSIS — I110 Hypertensive heart disease with heart failure: Secondary | ICD-10-CM | POA: Diagnosis not present

## 2023-02-11 DIAGNOSIS — I428 Other cardiomyopathies: Secondary | ICD-10-CM | POA: Diagnosis not present

## 2023-02-11 DIAGNOSIS — I5022 Chronic systolic (congestive) heart failure: Secondary | ICD-10-CM | POA: Insufficient documentation

## 2023-02-11 DIAGNOSIS — I4891 Unspecified atrial fibrillation: Secondary | ICD-10-CM | POA: Diagnosis not present

## 2023-02-11 DIAGNOSIS — Z95810 Presence of automatic (implantable) cardiac defibrillator: Secondary | ICD-10-CM | POA: Diagnosis not present

## 2023-02-11 HISTORY — PX: CARDIOVERSION: SHX1299

## 2023-02-11 SURGERY — CARDIOVERSION
Anesthesia: General

## 2023-02-11 MED ORDER — SODIUM CHLORIDE 0.9 % IV SOLN: INTRAVENOUS | Status: DC

## 2023-02-11 MED ORDER — SODIUM CHLORIDE 0.9 % IV BOLUS
250.0000 mL | Freq: Once | INTRAVENOUS | Status: AC
  Administered 2023-02-11: 250 mL via INTRAVENOUS

## 2023-02-11 MED ORDER — PHENYLEPHRINE 80 MCG/ML (10ML) SYRINGE FOR IV PUSH (FOR BLOOD PRESSURE SUPPORT)
PREFILLED_SYRINGE | INTRAVENOUS | Status: DC | PRN
  Administered 2023-02-11 (×2): 160 ug via INTRAVENOUS

## 2023-02-11 MED ORDER — PROPOFOL 10 MG/ML IV BOLUS
INTRAVENOUS | Status: AC
  Filled 2023-02-11: qty 20

## 2023-02-11 MED ORDER — PROPOFOL 10 MG/ML IV BOLUS
INTRAVENOUS | Status: DC | PRN
  Administered 2023-02-11: 20 mg via INTRAVENOUS
  Administered 2023-02-11: 70 mg via INTRAVENOUS

## 2023-02-11 NOTE — Anesthesia Procedure Notes (Signed)
Date/Time: 02/11/2023 8:00 AM  Performed by: Stormy Fabian, CRNAPre-anesthesia Checklist: Patient identified, Emergency Drugs available, Suction available and Patient being monitored Patient Re-evaluated:Patient Re-evaluated prior to induction Oxygen Delivery Method: Nasal cannula Induction Type: IV induction Dental Injury: Teeth and Oropharynx as per pre-operative assessment  Comments: Nasal cannula with etCO2 monitoring

## 2023-02-11 NOTE — Op Note (Signed)
[facility]   02/11/2023                     8:09 AM  PATIENT:  Jay [name]    PRE-OPERATIVE DIAGNOSIS:  Cardioversion   Afib OK Dr Randa Ngo  Start time 8a  POST-OPERATIVE DIAGNOSIS:  Same  PROCEDURE:  CARDIOVERSION  SURGEON:  Marcina Millard, MD    ANESTHESIA:     PREOPERATIVE INDICATIONS:  Jay [name] is a  72 y.o. male with a diagnosis of Cardioversion   Afib OK Dr Randa Ngo  Start time 8a who failed conservative measures and elected for surgical management.    The risks benefits and alternatives were discussed with the patient preoperatively including but not limited to the risks of infection, bleeding, cardiopulmonary complications, the need for revision surgery, among others, and the patient was willing to proceed.   OPERATIVE PROCEDURE: The patient presented to special procedures holding area in a fasting state.  ECG revealed atrial flutter at 110 bpm.  Patient received 90 mg of propofol.  Synchronized direct-current cardioversion was performed with 75 and 120 J with conversion to sinus rhythm.  ECG revealed atrial pacing with ventricular sensing at 89 bpm with QT 463 ms and QTc 558 ms.  There were no periprocedural complications.

## 2023-02-11 NOTE — Anesthesia Preprocedure Evaluation (Addendum)
Anesthesia Evaluation  Patient identified by MRN, date of birth, ID band Patient awake    Reviewed: Allergy & Precautions, NPO status , Patient's Chart, lab work & pertinent test results  History of Anesthesia Complications (+) history of anesthetic complications  Airway Mallampati: III  TM Distance: >3 FB Neck ROM: full    Dental  (+) Poor Dentition, Missing   Pulmonary shortness of breath (stable) and with exertion, sleep apnea    Pulmonary exam normal        Cardiovascular hypertension, On Medications + CAD, + Past MI and +CHF  + dysrhythmias (PVCs) Atrial Fibrillation and Ventricular Tachycardia + Cardiac Defibrillator  Rhythm:Irregular Rate:Tachycardia     Neuro/Psych negative neurological ROS  negative psych ROS   GI/Hepatic negative GI ROS, Neg liver ROS,,,  Endo/Other  negative endocrine ROS    Renal/GU negative Renal ROS  negative genitourinary   Musculoskeletal   Abdominal   Peds  Hematology negative hematology ROS (+)   Anesthesia Other Findings Past Medical History: No date: AICD (automatic cardioverter/defibrillator) present     Comment:  05/08/15 No date: Cardiomyopathy (HCC)     Comment:  NONISCHEMIC DILATED No date: CHF (congestive heart failure) (HCC)     Comment:  CHRONIC No date: Complication of anesthesia     Comment:  woke up doing surgeries No date: Coronary artery disease No date: Dyspnea     Comment:  CHRONIC DOE No date: Dysrhythmia     Comment:  SUSTAINED VT/BRADYCARDIA/PAROXYSMAL A FIB No date: Hypertension No date: MI (myocardial infarction) (HCC) No date: Presence of permanent cardiac pacemaker No date: Sleep apnea     Comment:  no cpap  Past Surgical History: No date: ABLATION     Comment:  FOR V/T 08/01/15 05/06/2015: CARDIAC CATHETERIZATION; N/A     Comment:  Procedure: Left Heart Cath and Coronary Angiography;                Surgeon: Marcina Millard, MD;   Location: [facility]               INVASIVE CV LAB;  Service: Cardiovascular;  Laterality:               N/A; No date: CATARACT EXTRACTION 09/23/2017: COLONOSCOPY WITH PROPOFOL; N/A     Comment:  Procedure: COLONOSCOPY WITH PROPOFOL;  Surgeon: Scot Jun, MD;  Location: [facility] ENDOSCOPY;  Service:               Endoscopy;  Laterality: N/A; No date: CORONARY ARTERY BYPASS GRAFT No date: EYE SURGERY No date: FRACTURE SURGERY No date: INSERT / REPLACE / REMOVE PACEMAKER No date: JOINT REPLACEMENT No date: RETINAL DETACHMENT SURGERY 02/10/2016: TOTAL HIP ARTHROPLASTY; Left     Comment:  Procedure: TOTAL HIP ARTHROPLASTY ANTERIOR APPROACH;                Surgeon: Kennedy Bucker, MD;  Location: [facility] ORS;  Service:              Orthopedics;  Laterality: Left;     Reproductive/Obstetrics negative OB ROS                             Anesthesia Physical Anesthesia Plan  ASA: 3  Anesthesia Plan: General   Post-op Pain Management: Minimal or no pain anticipated   Induction: Intravenous  PONV Risk Score and Plan: 1 and Propofol  infusion and TIVA  Airway Management Planned: Natural Airway and Nasal Cannula  Additional Equipment:   Intra-op Plan:   Post-operative Plan:   Informed Consent: I have reviewed the patients History and Physical, chart, labs and discussed the procedure including the risks, benefits and alternatives for the proposed anesthesia with the patient or authorized representative who has indicated his/her understanding and acceptance.     Dental Advisory Given  Plan Discussed with: Anesthesiologist, CRNA and Surgeon  Anesthesia Plan Comments: (Patient consented for risks of anesthesia including but not limited to:  - adverse reactions to medications - risk of airway placement if required - damage to eyes, teeth, lips or other oral mucosa - nerve damage due to positioning  - sore throat or hoarseness - Damage to heart, brain,  nerves, lungs, other parts of body or loss of life  Patient voiced understanding and assent.)       Anesthesia Quick Evaluation

## 2023-02-11 NOTE — Transfer of Care (Signed)
Immediate Anesthesia Transfer of Care Note  Patient: Jay [name]  Procedure(s) Performed: Procedure(s): CARDIOVERSION (N/A)  Patient Location: PACU and Short Stay  Anesthesia Type:General  Level of Consciousness: awake, alert  and oriented  Airway & Oxygen Therapy: Patient Spontanous Breathing and Patient connected to nasal cannula oxygen  Post-op Assessment: Report given to RN and Post -op Vital signs reviewed and stable  Post vital signs: Reviewed and stable  Last Vitals:  Vitals:   02/11/23 0813 02/11/23 0814  BP:  91/70  Pulse: 76 72  Resp: 18 20  SpO2: 97% 98%    Complications: No apparent anesthesia complications

## 2023-02-12 NOTE — Anesthesia Postprocedure Evaluation (Signed)
Anesthesia Post Note  Patient: Jay [name]  Procedure(s) Performed: CARDIOVERSION  Patient location during evaluation: Specials Recovery Anesthesia Type: General Level of consciousness: awake and alert Pain management: pain level controlled Vital Signs Assessment: post-procedure vital signs reviewed and stable Respiratory status: spontaneous breathing, nonlabored ventilation, respiratory function stable and patient connected to nasal cannula oxygen Cardiovascular status: blood pressure returned to baseline and stable Postop Assessment: no apparent nausea or vomiting Anesthetic complications: no   No notable events documented.   Last Vitals:  Vitals:   02/11/23 0914 02/11/23 0919  BP: 101/78 95/72  Pulse: 83 73  Resp: (!) 21 20  Temp:    SpO2: 100% 100%    Last Pain:  Vitals:   02/11/23 0919  PainSc: 2                  Louie Boston

## 2023-02-14 DIAGNOSIS — Z95810 Presence of automatic (implantable) cardiac defibrillator: Secondary | ICD-10-CM | POA: Diagnosis not present

## 2023-02-14 DIAGNOSIS — I5022 Chronic systolic (congestive) heart failure: Secondary | ICD-10-CM | POA: Diagnosis not present

## 2023-02-14 DIAGNOSIS — R0602 Shortness of breath: Secondary | ICD-10-CM | POA: Diagnosis not present

## 2023-02-14 DIAGNOSIS — Z9889 Other specified postprocedural states: Secondary | ICD-10-CM | POA: Diagnosis not present

## 2023-02-14 DIAGNOSIS — I2510 Atherosclerotic heart disease of native coronary artery without angina pectoris: Secondary | ICD-10-CM | POA: Diagnosis not present

## 2023-02-14 DIAGNOSIS — Z23 Encounter for immunization: Secondary | ICD-10-CM | POA: Diagnosis not present

## 2023-02-14 DIAGNOSIS — E1169 Type 2 diabetes mellitus with other specified complication: Secondary | ICD-10-CM | POA: Diagnosis not present

## 2023-02-14 DIAGNOSIS — I480 Paroxysmal atrial fibrillation: Secondary | ICD-10-CM | POA: Diagnosis not present

## 2023-02-14 DIAGNOSIS — I10 Essential (primary) hypertension: Secondary | ICD-10-CM | POA: Diagnosis not present

## 2023-02-16 DIAGNOSIS — R001 Bradycardia, unspecified: Secondary | ICD-10-CM | POA: Diagnosis not present

## 2023-02-16 DIAGNOSIS — Z95810 Presence of automatic (implantable) cardiac defibrillator: Secondary | ICD-10-CM | POA: Diagnosis not present

## 2023-02-16 DIAGNOSIS — I420 Dilated cardiomyopathy: Secondary | ICD-10-CM | POA: Diagnosis not present

## 2023-02-17 DIAGNOSIS — Z95810 Presence of automatic (implantable) cardiac defibrillator: Secondary | ICD-10-CM | POA: Diagnosis not present

## 2023-02-17 DIAGNOSIS — I472 Ventricular tachycardia, unspecified: Secondary | ICD-10-CM | POA: Diagnosis not present

## 2023-02-17 DIAGNOSIS — I480 Paroxysmal atrial fibrillation: Secondary | ICD-10-CM | POA: Diagnosis not present

## 2023-03-02 ENCOUNTER — Ambulatory Visit
Admission: RE | Admit: 2023-03-02 | Discharge: 2023-03-02 | Disposition: A | Discharge status: Home / Self Care | Payer: Medicare HMO | Source: Ambulatory Visit | Attending: Physician Assistant | Admitting: Physician Assistant

## 2023-03-02 ENCOUNTER — Other Ambulatory Visit: Payer: Self-pay | Admitting: Physician Assistant

## 2023-03-02 DIAGNOSIS — W19XXXA Unspecified fall, initial encounter: Secondary | ICD-10-CM | POA: Insufficient documentation

## 2023-03-02 DIAGNOSIS — S0990XA Unspecified injury of head, initial encounter: Secondary | ICD-10-CM | POA: Insufficient documentation

## 2023-03-02 DIAGNOSIS — Y92009 Unspecified place in unspecified non-institutional (private) residence as the place of occurrence of the external cause: Secondary | ICD-10-CM | POA: Insufficient documentation

## 2023-03-02 DIAGNOSIS — S0011XA Contusion of right eyelid and periocular area, initial encounter: Secondary | ICD-10-CM | POA: Diagnosis not present

## 2023-03-02 DIAGNOSIS — I252 Old myocardial infarction: Secondary | ICD-10-CM | POA: Diagnosis not present

## 2023-03-02 DIAGNOSIS — M25511 Pain in right shoulder: Secondary | ICD-10-CM | POA: Diagnosis not present

## 2023-03-02 DIAGNOSIS — Z043 Encounter for examination and observation following other accident: Secondary | ICD-10-CM | POA: Diagnosis not present

## 2023-03-02 DIAGNOSIS — W010XXA Fall on same level from slipping, tripping and stumbling without subsequent striking against object, initial encounter: Secondary | ICD-10-CM | POA: Diagnosis not present

## 2023-03-02 DIAGNOSIS — M19011 Primary osteoarthritis, right shoulder: Secondary | ICD-10-CM | POA: Diagnosis not present

## 2023-03-21 DIAGNOSIS — M25511 Pain in right shoulder: Secondary | ICD-10-CM | POA: Diagnosis not present

## 2023-03-22 DIAGNOSIS — E782 Mixed hyperlipidemia: Secondary | ICD-10-CM | POA: Diagnosis not present

## 2023-03-22 DIAGNOSIS — E1169 Type 2 diabetes mellitus with other specified complication: Secondary | ICD-10-CM | POA: Diagnosis not present

## 2023-03-22 DIAGNOSIS — E785 Hyperlipidemia, unspecified: Secondary | ICD-10-CM | POA: Diagnosis not present

## 2023-03-22 DIAGNOSIS — I10 Essential (primary) hypertension: Secondary | ICD-10-CM | POA: Diagnosis not present

## 2023-03-23 ENCOUNTER — Other Ambulatory Visit: Payer: Self-pay | Admitting: Orthopedic Surgery

## 2023-03-23 DIAGNOSIS — M25511 Pain in right shoulder: Secondary | ICD-10-CM

## 2023-03-24 ENCOUNTER — Encounter: Payer: Self-pay | Admitting: Orthopedic Surgery

## 2023-03-28 ENCOUNTER — Ambulatory Visit
Admission: RE | Admit: 2023-03-28 | Discharge: 2023-03-28 | Disposition: A | Discharge status: Home / Self Care | Payer: Medicare HMO | Source: Ambulatory Visit | Attending: Orthopedic Surgery | Admitting: Orthopedic Surgery

## 2023-03-28 DIAGNOSIS — M25511 Pain in right shoulder: Secondary | ICD-10-CM

## 2023-03-28 DIAGNOSIS — M12811 Other specific arthropathies, not elsewhere classified, right shoulder: Secondary | ICD-10-CM | POA: Diagnosis not present

## 2023-03-28 DIAGNOSIS — M19011 Primary osteoarthritis, right shoulder: Secondary | ICD-10-CM | POA: Diagnosis not present

## 2023-03-29 DIAGNOSIS — E785 Hyperlipidemia, unspecified: Secondary | ICD-10-CM | POA: Diagnosis not present

## 2023-03-29 DIAGNOSIS — Z1331 Encounter for screening for depression: Secondary | ICD-10-CM | POA: Diagnosis not present

## 2023-03-29 DIAGNOSIS — Z0000 Encounter for general adult medical examination without abnormal findings: Secondary | ICD-10-CM | POA: Diagnosis not present

## 2023-03-29 DIAGNOSIS — E119 Type 2 diabetes mellitus without complications: Secondary | ICD-10-CM | POA: Diagnosis not present

## 2023-03-29 DIAGNOSIS — I10 Essential (primary) hypertension: Secondary | ICD-10-CM | POA: Diagnosis not present

## 2023-04-01 ENCOUNTER — Other Ambulatory Visit: Payer: Self-pay | Admitting: Orthopedic Surgery

## 2023-04-01 DIAGNOSIS — Z9889 Other specified postprocedural states: Secondary | ICD-10-CM | POA: Diagnosis not present

## 2023-04-01 DIAGNOSIS — S0511XA Contusion of eyeball and orbital tissues, right eye, initial encounter: Secondary | ICD-10-CM | POA: Diagnosis not present

## 2023-04-01 DIAGNOSIS — Z961 Presence of intraocular lens: Secondary | ICD-10-CM | POA: Diagnosis not present

## 2023-04-01 DIAGNOSIS — R7303 Prediabetes: Secondary | ICD-10-CM | POA: Diagnosis not present

## 2023-04-11 DIAGNOSIS — I5022 Chronic systolic (congestive) heart failure: Secondary | ICD-10-CM | POA: Diagnosis not present

## 2023-04-12 ENCOUNTER — Encounter: Payer: Self-pay | Admitting: Internal Medicine

## 2023-04-17 ENCOUNTER — Encounter: Payer: Self-pay | Admitting: Urgent Care

## 2023-04-19 ENCOUNTER — Ambulatory Visit: Payer: Medicare HMO | Admitting: Certified Registered"

## 2023-04-19 ENCOUNTER — Encounter
Admission: RE | Disposition: A | Discharge status: Home / Self Care | Payer: Self-pay | Source: Home / Self Care | Attending: Internal Medicine

## 2023-04-19 ENCOUNTER — Encounter: Payer: Self-pay | Admitting: Internal Medicine

## 2023-04-19 ENCOUNTER — Inpatient Hospital Stay: Admission: RE | Admit: 2023-04-19 | Payer: Medicare HMO | Source: Ambulatory Visit

## 2023-04-19 ENCOUNTER — Ambulatory Visit
Admission: RE | Admit: 2023-04-19 | Discharge: 2023-04-19 | Disposition: A | Discharge status: Home / Self Care | Payer: Medicare HMO | Attending: Internal Medicine | Admitting: Internal Medicine

## 2023-04-19 DIAGNOSIS — Z860100 Personal history of colon polyps, unspecified: Secondary | ICD-10-CM | POA: Diagnosis not present

## 2023-04-19 DIAGNOSIS — I2510 Atherosclerotic heart disease of native coronary artery without angina pectoris: Secondary | ICD-10-CM | POA: Insufficient documentation

## 2023-04-19 DIAGNOSIS — Z1211 Encounter for screening for malignant neoplasm of colon: Secondary | ICD-10-CM | POA: Insufficient documentation

## 2023-04-19 DIAGNOSIS — I252 Old myocardial infarction: Secondary | ICD-10-CM | POA: Insufficient documentation

## 2023-04-19 DIAGNOSIS — G4730 Sleep apnea, unspecified: Secondary | ICD-10-CM | POA: Insufficient documentation

## 2023-04-19 DIAGNOSIS — K635 Polyp of colon: Secondary | ICD-10-CM | POA: Diagnosis not present

## 2023-04-19 DIAGNOSIS — I110 Hypertensive heart disease with heart failure: Secondary | ICD-10-CM | POA: Diagnosis not present

## 2023-04-19 DIAGNOSIS — K641 Second degree hemorrhoids: Secondary | ICD-10-CM | POA: Insufficient documentation

## 2023-04-19 DIAGNOSIS — K5730 Diverticulosis of large intestine without perforation or abscess without bleeding: Secondary | ICD-10-CM | POA: Diagnosis not present

## 2023-04-19 DIAGNOSIS — D123 Benign neoplasm of transverse colon: Secondary | ICD-10-CM | POA: Diagnosis not present

## 2023-04-19 DIAGNOSIS — D122 Benign neoplasm of ascending colon: Secondary | ICD-10-CM | POA: Insufficient documentation

## 2023-04-19 DIAGNOSIS — Z95810 Presence of automatic (implantable) cardiac defibrillator: Secondary | ICD-10-CM | POA: Diagnosis not present

## 2023-04-19 DIAGNOSIS — E785 Hyperlipidemia, unspecified: Secondary | ICD-10-CM | POA: Insufficient documentation

## 2023-04-19 DIAGNOSIS — I420 Dilated cardiomyopathy: Secondary | ICD-10-CM | POA: Diagnosis not present

## 2023-04-19 DIAGNOSIS — I480 Paroxysmal atrial fibrillation: Secondary | ICD-10-CM | POA: Diagnosis not present

## 2023-04-19 DIAGNOSIS — Z7901 Long term (current) use of anticoagulants: Secondary | ICD-10-CM | POA: Diagnosis not present

## 2023-04-19 DIAGNOSIS — I5022 Chronic systolic (congestive) heart failure: Secondary | ICD-10-CM | POA: Insufficient documentation

## 2023-04-19 DIAGNOSIS — K648 Other hemorrhoids: Secondary | ICD-10-CM | POA: Diagnosis not present

## 2023-04-19 HISTORY — PX: POLYPECTOMY: SHX5525

## 2023-04-19 HISTORY — PX: COLONOSCOPY WITH PROPOFOL: SHX5780

## 2023-04-19 SURGERY — COLONOSCOPY WITH PROPOFOL
Anesthesia: General

## 2023-04-19 MED ORDER — PROPOFOL 500 MG/50ML IV EMUL
INTRAVENOUS | Status: DC | PRN
  Administered 2023-04-19: 135 ug/kg/min via INTRAVENOUS

## 2023-04-19 MED ORDER — PROPOFOL 10 MG/ML IV BOLUS
INTRAVENOUS | Status: DC | PRN
  Administered 2023-04-19: 40 mg via INTRAVENOUS

## 2023-04-19 MED ORDER — SODIUM CHLORIDE 0.9 % IV SOLN: INTRAVENOUS | Status: DC

## 2023-04-19 NOTE — Interval H&P Note (Signed)
 History and Physical Interval Note:  04/19/2023 11:06 AM  Jay [name]  has presented today for surgery, with the diagnosis of Z86.0100 (ICD-10-CM) - Personal history of colon polyps, unspecified.  The various methods of treatment have been discussed with the patient and family. After consideration of risks, benefits and other options for treatment, the patient has consented to  Procedure(s): COLONOSCOPY WITH PROPOFOL  (N/A) as a surgical intervention.  The patient's history has been reviewed, patient examined, no change in status, stable for surgery.  I have reviewed the patient's chart and labs.  Questions were answered to the patient's satisfaction.     Prosser, Arrington Yohe

## 2023-04-19 NOTE — H&P (Signed)
 Outpatient short stay form Pre-procedure 04/19/2023 11:05 AM [name] K. Aundria, M.D.  Primary Physician: [name], M.D.  Reason for visit:  Personal history of colon polyps - 2019  History of present illness:  Jay [name] is a 73 y.o. male who presents to the [facility] for a consultation at the request of Dr. Carpen for evaluation of colonoscopy. Past medical history of dilated cardiomyopathy, systolic CHF, dual-chamber ICD placed 2017, hypertension, hyperlipidemia, sleep apnea, atrial fib. He is maintained on Eliquis  for stroke prevention.     Current Facility-Administered Medications:    0.9 %  sodium chloride  infusion, , Intravenous, Continuous, Krish Bailly K, MD  Medications Prior to Admission  Medication Sig Dispense Refill Last Dose/Taking   Cholecalciferol  (VITAMIN D3) 50 MCG (2000 UT) capsule Take 2,000 Units by mouth daily.   Past Week   cyanocobalamin  (VITAMIN B12) 1000 MCG tablet Take 1,000 mcg by mouth daily.   Past Week   losartan (COZAAR) 25 MG tablet Take 25 mg by mouth daily.   04/19/2023 Morning   lovastatin (MEVACOR) 40 MG tablet Take 40 mg by mouth daily after supper.    04/18/2023   metoprolol succinate (TOPROL-XL) 50 MG 24 hr tablet Take 50 mg by mouth daily.   04/19/2023 Morning   sotalol  (BETAPACE ) 120 MG tablet Take 120 mg by mouth 2 (two) times daily.   04/19/2023 Morning   Vitamin E  180 MG (400 UNIT) CAPS Take 400 Units by mouth every morning.   Past Week   albuterol  (VENTOLIN  HFA) 108 (90 Base) MCG/ACT inhaler Inhale 1-2 puffs into the lungs every 6 (six) hours as needed for wheezing or shortness of breath.      apixaban  (ELIQUIS ) 5 MG TABS tablet Take 5 mg by mouth 2 (two) times daily.   04/17/2023   carboxymethylcellulose (REFRESH PLUS) 0.5 % SOLN 1 drop as needed (dry eyes).      fluticasone  (FLONASE ) 50 MCG/ACT nasal spray Place 1 spray into both nostrils daily as needed for allergies or rhinitis.      Multiple Vitamins-Minerals (CENTRUM  SILVER ULTRA MENS) TABS Take 1 tablet by mouth every morning.      vitamin C  (ASCORBIC ACID ) 500 MG tablet Take 500 mg by mouth daily.      zinc gluconate 50 MG tablet Take 50 mg by mouth daily.        Allergies  Allergen Reactions   Pollen Extract     Congestion, itchy eyes     Past Medical History:  Diagnosis Date   AICD (automatic cardioverter/defibrillator) present    05/08/15   Cardiomyopathy (HCC)    NONISCHEMIC DILATED   CHF (congestive heart failure) (HCC)    CHRONIC   Complication of anesthesia    woke up doing surgeries   Coronary artery disease    Dyspnea    CHRONIC DOE   Dysrhythmia    SUSTAINED VT/BRADYCARDIA/PAROXYSMAL A FIB   Hypertension    MI (myocardial infarction) (HCC)    Presence of permanent cardiac pacemaker    Sleep apnea    no cpap    Review of systems:  Otherwise negative.    Physical Exam  Gen: Alert, oriented. Appears stated age.  HEENT: [place]/AT. PERRLA. Lungs: CTA, no wheezes. CV: RR nl S1, S2. Abd: soft, benign, no masses. BS+ Ext: No edema. Pulses 2+    Planned procedures: Proceed with colonoscopy. The patient understands the nature of the planned procedure, indications, risks, alternatives and potential complications including but not limited to  bleeding, infection, perforation, damage to internal organs and possible oversedation/side effects from anesthesia. The patient agrees and gives consent to proceed.  Please refer to procedure notes for findings, recommendations and patient disposition/instructions.     [name] K. Aundria, M.D. Gastroenterology 04/19/2023  11:05 AM

## 2023-04-19 NOTE — Op Note (Signed)
 [facility] Gastroenterology Patient Name: Jay [name] Procedure Date: 04/19/2023 11:33 AM MRN: [id number] Account #: [id number] Date of Birth: 08-04-1950 Admit Type: Outpatient Age: 73 Room: [facility] ENDO ROOM 3 Gender: Male Note Status: Finalized Instrument Name: Arvis [id number] Procedure:             Colonoscopy Indications:           Surveillance: Personal history of colonic polyps                         (unknown histology) on last colonoscopy more than 5                         years ago Providers:             Arnold Kester K. Aluna Whiston MD, MD Medicines:             Propofol  per Anesthesia Complications:         No immediate complications. Estimated blood loss:                         Minimal. Procedure:             Pre-Anesthesia Assessment:                        - The risks and benefits of the procedure and the                         sedation options and risks were discussed with the                         patient. All questions were answered and informed                         consent was obtained.                        - Patient identification and proposed procedure were                         verified prior to the procedure by the nurse. The                         procedure was verified in the procedure room.                        - After reviewing the risks and benefits, the patient                         was deemed in satisfactory condition to undergo the                         procedure.                        - ASA Grade Assessment: III - A patient with severe                         systemic disease.  After obtaining informed consent, the colonoscope was                         passed under direct vision. Throughout the procedure,                         the patient's blood pressure, pulse, and oxygen                         saturations were monitored continuously. The                         Colonoscope was introduced  through the anus and                         advanced to the the cecum, identified by appendiceal                         orifice and ileocecal valve. The colonoscopy was                         performed without difficulty. The patient tolerated                         the procedure well. The quality of the bowel                         preparation was good. The ileocecal valve, appendiceal                         orifice, and rectum were photographed. Findings:      The perianal and digital rectal examinations were normal. Pertinent       negatives include normal sphincter tone and no palpable rectal lesions.      Non-bleeding internal hemorrhoids were found during retroflexion. The       hemorrhoids were Grade II (internal hemorrhoids that prolapse but reduce       spontaneously).      Many small-mouthed diverticula were found in the sigmoid colon.      A 4 mm polyp was found in the transverse colon. The polyp was sessile.       The polyp was removed with a cold biopsy forceps. Resection and       retrieval were complete.      An 8 mm polyp was found in the ascending colon. The polyp was sessile.       The polyp was removed with a cold snare. Resection and retrieval were       complete.      The exam was otherwise without abnormality. Impression:            - Non-bleeding internal hemorrhoids.                        - Diverticulosis in the sigmoid colon.                        - One 4 mm polyp in the transverse colon, removed with                         a  cold biopsy forceps. Resected and retrieved.                        - One 8 mm polyp in the ascending colon, removed with                         a cold snare. Resected and retrieved.                        - The examination was otherwise normal. Recommendation:        - Patient has a contact number available for                         emergencies. The signs and symptoms of potential                         delayed complications  were discussed with the patient.                         Return to normal activities tomorrow. Written                         discharge instructions were provided to the patient.                        - Resume previous diet.                        - Continue present medications.                        - If polyps are benign or adenomatous without                         dysplasia, I will advise NO further colonoscopy due to                         advanced age and/or severe comorbidity.                        - Resume Eliquis  (apixaban ) at prior dose tomorrow.                         Refer to managing physician for further adjustment of                         therapy.                        - Return to GI office PRN.                        - The findings and recommendations were discussed with                         the patient. Procedure Code(s):     --- Professional ---                        [phone], Colonoscopy, flexible; with removal of  tumor(s), polyp(s), or other lesion(s) by snare                         technique                        45380, 59, Colonoscopy, flexible; with biopsy, single                         or multiple Diagnosis Code(s):     --- Professional ---                        K57.30, Diverticulosis of large intestine without                         perforation or abscess without bleeding                        D12.2, Benign neoplasm of ascending colon                        D12.3, Benign neoplasm of transverse colon (hepatic                         flexure or splenic flexure)                        K64.1, Second degree hemorrhoids                        Z86.010, Personal history of colonic polyps CPT copyright 2022 American [employer]. All rights reserved. The codes documented in this report are preliminary and upon coder review may  be revised to meet current compliance requirements. Ladell MARLA Boss MD, MD 04/19/2023 11:56:19  AM This report has been signed electronically. Number of Addenda: 0 Note Initiated On: 04/19/2023 11:33 AM Scope Withdrawal Time: 0 hours 5 minutes 48 seconds  Total Procedure Duration: 0 hours 11 minutes 50 seconds  Estimated Blood Loss:  Estimated blood loss was minimal.      [facility]

## 2023-04-19 NOTE — Anesthesia Procedure Notes (Signed)
 Procedure Name: MAC Date/Time: 04/19/2023 11:37 AM  Performed by: Nelle Don, CRNAPre-anesthesia Checklist: Patient identified, Emergency Drugs available, Suction available and Patient being monitored Oxygen Delivery Method: Nasal cannula

## 2023-04-19 NOTE — Anesthesia Preprocedure Evaluation (Addendum)
 Anesthesia Evaluation  Patient identified by MRN, date of birth, ID band Patient awake    Reviewed: Allergy & Precautions, NPO status , Patient's Chart, lab work & pertinent test results  History of Anesthesia Complications Negative for: history of anesthetic complications  Airway Mallampati: I       Dental  (+) Poor Dentition   Pulmonary sleep apnea    Pulmonary exam normal breath sounds clear to auscultation       Cardiovascular hypertension, + CAD, + Past MI and +CHF (NICM, EF 40%)  + dysrhythmias (a fib on Eliquis ) + Cardiac Defibrillator  Rhythm:Irregular Rate:Normal  ECG 02/11/23: A-V dual-paced complexes w/ some inhibition  Myocardial perfusion 01/17/23:  1.  Moderate to severely reduced left ventricular function  2.  Global hypokinesis  3.  Mild inferior apical scar without significant ischemia   Echo 08/19/21:  MILD LV SYSTOLIC DYSFUNCTION  NORMAL RIGHT VENTRICULAR SYSTOLIC FUNCTION  MILD VALVULAR REGURGITATION  NO VALVULAR STENOSIS  *TDS - POOR SOUND TRANSMISSION  ESTIMATED LVEF 40%  Aortic: TRIVIAL AI  Mitral: MILD MR  Tricuspid: TRIVIAL TR  Pulmonic: TRIVIAL PI     Neuro/Psych negative neurological ROS     GI/Hepatic negative GI ROS,,,  Endo/Other  negative endocrine ROS    Renal/GU negative Renal ROS     Musculoskeletal   Abdominal   Peds  Hematology negative hematology ROS (+)   Anesthesia Other Findings Cardiology note 04/11/23:  Chronic systolic CHF (congestive heart failure; EF 35% - 01/17/19)  Paroxysmal atrial fibrillation on eliquis  s/p cardioversion (02/2023) - followed by Dr. Ammon  Essential hypertension  Coronary artery disease, non-occlusive  ICD (implantable cardioverter-defibrillator) in place   SOB (shortness of breath) on exertion  Mixed hyperlipidemia (LDL 64 - 03/22/23)  73 year old gentleman with nonischemic dilated cardiomyopathy, chronic systolic congestive  heart failure, hospitalized with ventricular tachycardia, underwent catheter ablation for ventricular tachycardia at [facility] 08/01/2015. Patient has paroxysmal atrial fibrillation, on Eliquis  for stroke prevention, and sotalol  for rhythm control. 2D echocardiogram 08/19/2021 revealed LVEF 40%. Patient has essential hypertension, blood pressure well controlled on current BP medications. The patient had an episode of syncope on 5/29 while working in his garden. ICD interrogation shows a 3 second episode of VF in monitor zone and 15 episodes of NSVT since the last interrogation. In light of this and patient's known history of catheter ablation for VT, he was referred back to EP. Holter monitor revealed predominant sinus rhythm with mean heart rate 82 bpm, sinus heart rate range 58 to 129 bpm. Frequent premature ventricular contractions, and frequent brief atrial runs were observed. Lexiscan  Myoview 01/17/2023 revealed LVEF 28% with mild inferior scar without significant ischemia. Cardiac stress MRI is scheduled for 02/17/2023 and discussion was made about possible repeat VT ablation per Dr. Melchor. Patient returns today, complains of persistent elevated heart rate despite uptitrating metoprolol succinate. ECG reveals atrial flutter at 123 bpm. The patient underwent successful cardioversion on 02/11/2023 at which time sotalol  was increased to 120 mg twice daily and metoprolol succinate decreased to 50 mg daily. Post cardioversion ECG revealed atrial pacing with ventricular sensing, QT 463 ms, QTc 558 ms. His symptoms have resolved on increased dose of sotalol . He is scheduled for AF catheter ablation 05/17/2023.   Plan: Continue current medications Continue sotalol  120 mg BID Continue Eliquis  for stroke prevention Continue heart healthy, low sodium diet Catheter ablation 05/17/2023; follow up with EP after Follow up with Dr. Ammon in 3 months.    Reproductive/Obstetrics  Anesthesia Physical Anesthesia Plan  ASA: 3  Anesthesia Plan: General   Post-op Pain Management:    Induction: Intravenous  PONV Risk Score and Plan: 2 and Propofol  infusion, TIVA and Treatment may vary due to age or medical condition  Airway Management Planned: Natural Airway  Additional Equipment:   Intra-op Plan:   Post-operative Plan:   Informed Consent: I have reviewed the patients History and Physical, chart, labs and discussed the procedure including the risks, benefits and alternatives for the proposed anesthesia with the patient or authorized representative who has indicated his/her understanding and acceptance.       Plan Discussed with: CRNA  Anesthesia Plan Comments: (LMA/GETA backup discussed.  Patient consented for risks of anesthesia including but not limited to:  - adverse reactions to medications - damage to eyes, teeth, lips or other oral mucosa - nerve damage due to positioning  - sore throat or hoarseness - damage to heart, brain, nerves, lungs, other parts of body or loss of life  Informed patient about role of CRNA in peri- and intra-operative care.  Patient voiced understanding.)        Anesthesia Quick Evaluation

## 2023-04-19 NOTE — Transfer of Care (Signed)
 Immediate Anesthesia Transfer of Care Note  Patient: Jay [name]  Procedure(s) Performed: COLONOSCOPY WITH PROPOFOL  POLYPECTOMY  Patient Location: PACU  Anesthesia Type:General  Level of Consciousness: drowsy  Airway & Oxygen Therapy: Patient Spontanous Breathing  Post-op Assessment: Report given to RN, Post -op Vital signs reviewed and stable, and Patient moving all extremities X 4  Post vital signs: Reviewed and stable  Last Vitals:  Vitals Value Taken Time  BP 98/69 04/19/23 1155  Temp    Pulse 74 04/19/23 1155  Resp 16 04/19/23 1155  SpO2 99 % 04/19/23 1155    Last Pain:  Vitals:   04/19/23 1008  TempSrc: Temporal         Complications: No notable events documented.

## 2023-04-20 ENCOUNTER — Encounter: Payer: Self-pay | Admitting: Internal Medicine

## 2023-04-20 LAB — SURGICAL PATHOLOGY

## 2023-04-20 NOTE — Anesthesia Postprocedure Evaluation (Signed)
 Anesthesia Post Note  Patient: Jay [name]  Procedure(s) Performed: COLONOSCOPY WITH PROPOFOL  POLYPECTOMY  Patient location during evaluation: PACU Anesthesia Type: General Level of consciousness: awake and alert, oriented and patient cooperative Pain management: pain level controlled Vital Signs Assessment: post-procedure vital signs reviewed and stable Respiratory status: spontaneous breathing, nonlabored ventilation and respiratory function stable Cardiovascular status: blood pressure returned to baseline and stable Postop Assessment: adequate PO intake Anesthetic complications: no   No notable events documented.   Last Vitals:  Vitals:   04/19/23 1008 04/19/23 1155  BP: 107/71 98/69  Pulse: 75 74  Resp: 16 16  Temp: (!) 36.3 C (!) 36.1 C  SpO2: 99% 99%    Last Pain:  Vitals:   04/19/23 1008  TempSrc: Temporal                 Azlan Hanway

## 2023-04-21 ENCOUNTER — Other Ambulatory Visit: Payer: Self-pay

## 2023-04-21 ENCOUNTER — Encounter
Admission: RE | Admit: 2023-04-21 | Discharge: 2023-04-21 | Disposition: A | Discharge status: Home / Self Care | Payer: Medicare HMO | Source: Ambulatory Visit | Attending: Orthopedic Surgery | Admitting: Orthopedic Surgery

## 2023-04-21 VITALS — BP 121/80 | HR 44 | Resp 14 | Ht 72.0 in | Wt 185.0 lb

## 2023-04-21 DIAGNOSIS — E119 Type 2 diabetes mellitus without complications: Secondary | ICD-10-CM | POA: Diagnosis not present

## 2023-04-21 DIAGNOSIS — Z01818 Encounter for other preprocedural examination: Secondary | ICD-10-CM | POA: Insufficient documentation

## 2023-04-21 HISTORY — DX: Type 2 diabetes mellitus without complications: E11.9

## 2023-04-21 LAB — URINALYSIS, ROUTINE W REFLEX MICROSCOPIC
Bilirubin Urine: NEGATIVE
Glucose, UA: NEGATIVE mg/dL
Hgb urine dipstick: NEGATIVE
Ketones, ur: NEGATIVE mg/dL
Leukocytes,Ua: NEGATIVE
Nitrite: NEGATIVE
Protein, ur: NEGATIVE mg/dL
Specific Gravity, Urine: 1.026 (ref 1.005–1.030)
pH: 5 (ref 5.0–8.0)

## 2023-04-21 LAB — SURGICAL PCR SCREEN
MRSA, PCR: NEGATIVE
Staphylococcus aureus: NEGATIVE

## 2023-04-21 LAB — TYPE AND SCREEN
ABO/RH(D): AB POS
Antibody Screen: NEGATIVE

## 2023-04-21 NOTE — Patient Instructions (Addendum)
 Your procedure is scheduled on: Tuesday 04/26/23 To find out your arrival time, please call [phone] between 1PM - 3PM on:   Monday 04/25/23 Report to the Registration Desk on the 1st floor of the [facility]. FREE Valet parking is available.  If your arrival time is 6:00 am, do not arrive before that time as the [facility] entrance doors do not open until 6:00 am.  REMEMBER: Instructions that are not followed completely may result in serious medical risk, up to and including death; or upon the discretion of your surgeon and anesthesiologist your surgery may need to be rescheduled.  Do not eat food after midnight the night before surgery.  No gum chewing or hard candies.  You may however, drink CLEAR liquids up to 2 hours before you are scheduled to arrive for your surgery. Do not drink anything within 2 hours of your scheduled arrival time.  Clear liquids include: - water   Type 1 and Type 2 diabetics should only drink water.  In addition, your doctor has ordered for you to drink the provided:   Gatorade G2 Drinking this carbohydrate drink up to two hours before surgery helps to reduce insulin resistance and improve patient outcomes. Please complete drinking 2 hours before scheduled arrival time.  One week prior to surgery: Stop Anti-inflammatories (NSAIDS) such as Advil, Aleve, Ibuprofen, Motrin, Naproxen, Naprosyn and Aspirin  based products such as Excedrin, Goody's Powder, BC Powder. You may however, continue to take Tylenol  if needed for pain up until the day of surgery.  Stop ANY OVER THE COUNTER supplements and vitamins Today 04/21/23 until after surgery.  Continue taking all prescribed medications with the exception of the following: Eliquis , last dose will be Saturday night 04/23/23  TAKE ONLY THESE MEDICATIONS THE MORNING OF SURGERY WITH A SIP OF WATER:  metoprolol succinate (TOPROL-XL) 50 MG 24 hr tablet  sotalol  (BETAPACE ) 120 MG tablet   Use inhalers on the day  of surgery and bring to the hospital.  No Alcohol for 24 hours before or after surgery.  No Smoking including e-cigarettes for 24 hours before surgery.  No chewable tobacco products for at least 6 hours before surgery.  No nicotine patches on the day of surgery.  Do not use any recreational drugs for at least a week (preferably 2 weeks) before your surgery.  Please be advised that the combination of cocaine and anesthesia may have negative outcomes, up to and including death. If you test positive for cocaine, your surgery will be cancelled.  On the morning of surgery brush your teeth with toothpaste and water, you may rinse your mouth with mouthwash if you wish. Do not swallow any toothpaste or mouthwash.  Use CHG Soap or wipes as directed on instruction sheet.  Do not wear lotions, powders, or perfumes and NO DEODORANT on the day of surgery  Do not shave body hair from the neck down 48 hours before surgery.  Wear clean comfortable clothing (specific to your surgery type) to the hospital.  Do not wear jewelry, make-up, hairpins, clips or nail polish.  For welded (permanent) jewelry: bracelets, anklets, waist bands, etc.  Please have this removed prior to surgery.  If it is not removed, there is a chance that hospital personnel will need to cut it off on the day of surgery. Contact lenses, hearing aids and dentures may not be worn into surgery.  Do not bring valuables to the hospital. [facility] is not responsible for any missing/lost belongings or valuables.  Total Shoulder Arthroplasty:  use Benzoyl Peroxide 5% Gel as directed on instruction sheet.  Notify your doctor if there is any change in your medical condition (cold, fever, infection).  If you are being discharged the day of surgery, you will not be allowed to drive home. You will need a responsible individual to drive you home and stay with you for 24 hours after surgery.   If you are taking public transportation, you  will need to have a responsible individual with you.  If you are being admitted to the hospital overnight, leave your suitcase in the car. After surgery it may be brought to your room.  In case of increased patient census, it may be necessary for you, the patient, to continue your postoperative care in the Same Day Surgery department.  After surgery, you can help prevent lung complications by doing breathing exercises.  Take deep breaths and cough every 1-2 hours. Your doctor may order a device called an Incentive Spirometer to help you take deep breaths. When coughing or sneezing, hold a pillow firmly against your incision with both hands. This is called "splinting." Doing this helps protect your incision. It also decreases belly discomfort.  Surgery Visitation Policy:  Patients undergoing a surgery or procedure may have two family members or support persons with them as long as the person is not COVID-19 positive or experiencing its symptoms.   Inpatient Visitation:    Visiting hours are 7 a.m. to 8 p.m. Up to four visitors are allowed at one time in a patient room. The visitors may rotate out with other people during the day. One designated support person (adult) may remain overnight.  Please call the Pre-admissions Testing Dept. at [phone] if you have any questions about these instructions.    Pre-operative 5 CHG Bath Instructions   You can play a key role in reducing the risk of infection after surgery. Your skin needs to be as free of germs as possible. You can reduce the number of germs on your skin by washing with CHG (chlorhexidine gluconate) soap before surgery. CHG is an antiseptic soap that kills germs and continues to kill germs even after washing.   DO NOT use if you have an allergy to chlorhexidine/CHG or antibacterial soaps. If your skin becomes reddened or irritated, stop using the CHG and notify one of our RNs at [phone].  Friday 04/22/23 - Tuesday  04/26/23  Please shower with the CHG soap starting 4 days before surgery using the following schedule:     Please keep in mind the following:  DO NOT shave, including legs and underarms, starting the day of your first shower.   You may shave your face at any point before/day of surgery.  Place clean sheets on your bed the day you start using CHG soap. Use a clean washcloth (not used since being washed) for each shower. DO NOT sleep with pets once you start using the CHG.   CHG Shower Instructions:  If you choose to wash your hair and private area, wash first with your normal shampoo/soap.  After you use shampoo/soap, rinse your hair and body thoroughly to remove shampoo/soap residue.  Turn the water OFF and apply about 3 tablespoons (45 ml) of CHG soap to a CLEAN washcloth.  Apply CHG soap ONLY FROM YOUR NECK DOWN TO YOUR TOES (washing for 3-5 minutes)  DO NOT use CHG soap on face, private areas, open wounds, or sores.  Pay special attention to the area where your  surgery is being performed.  If you are having back surgery, having someone wash your back for you may be helpful. Wait 2 minutes after CHG soap is applied, then you may rinse off the CHG soap.  Pat dry with a clean towel  Put on clean clothes/pajamas   If you choose to wear lotion, please use ONLY the CHG-compatible lotions on the back of this paper.     Additional instructions for the day of surgery: DO NOT APPLY any lotions, deodorants, cologne, or perfumes.   Put on clean/comfortable clothes.  Brush your teeth.  Ask your nurse before applying any prescription medications to the skin.      CHG Compatible Lotions   Aveeno Moisturizing lotion  Cetaphil Moisturizing Cream  Cetaphil Moisturizing Lotion  Clairol Herbal Essence Moisturizing Lotion, Dry Skin  Clairol Herbal Essence Moisturizing Lotion, Extra Dry Skin  Clairol Herbal Essence Moisturizing Lotion, Normal Skin  Curel Age Defying Therapeutic Moisturizing  Lotion with Alpha Hydroxy  Curel Extreme Care Body Lotion  Curel Soothing Hands Moisturizing Hand Lotion  Curel Therapeutic Moisturizing Cream, Fragrance-Free  Curel Therapeutic Moisturizing Lotion, Fragrance-Free  Curel Therapeutic Moisturizing Lotion, Original Formula  Eucerin Daily Replenishing Lotion  Eucerin Dry Skin Therapy Plus Alpha Hydroxy Crme  Eucerin Dry Skin Therapy Plus Alpha Hydroxy Lotion  Eucerin Original Crme  Eucerin Original Lotion  Eucerin Plus Crme Eucerin Plus Lotion  Eucerin TriLipid Replenishing Lotion  Keri Anti-Bacterial Hand Lotion  Keri Deep Conditioning Original Lotion Dry Skin Formula Softly Scented  Keri Deep Conditioning Original Lotion, Fragrance Free Sensitive Skin Formula  Keri Lotion Fast Absorbing Fragrance Free Sensitive Skin Formula  Keri Lotion Fast Absorbing Softly Scented Dry Skin Formula  Keri Original Lotion  Keri Skin Renewal Lotion Keri Silky Smooth Lotion  Keri Silky Smooth Sensitive Skin Lotion  Nivea Body Creamy Conditioning Oil  Nivea Body Extra Enriched Lotion  Nivea Body Original Lotion  Nivea Body Sheer Moisturizing Lotion Nivea Crme  Nivea Skin Firming Lotion  NutraDerm 30 Skin Lotion  NutraDerm Skin Lotion  NutraDerm Therapeutic Skin Cream  NutraDerm Therapeutic Skin Lotion  ProShield Protective Hand Cream  Provon moisturizing lotion  Preparing for Total Shoulder Arthroplasty  Before surgery, you can play an important role by reducing the number of germs on your skin by using the following products:  Benzoyl Peroxide Gel  o Reduces the number of germs present on the skin  o Applied twice a day to shoulder area starting two days before surgery  Chlorhexidine Gluconate (CHG) Soap  o An antiseptic cleaner that kills germs and bonds with the skin to continue killing germs even after washing  o Used for showering the night before surgery and morning of surgery  BENZOYL PEROXIDE 5% GEL  Please do not use if you  have an allergy to benzoyl peroxide. If your skin becomes reddened/irritated stop using the benzoyl peroxide.  Starting two days before surgery, apply as follows:  1. Apply benzoyl peroxide in the morning and at night. Apply after taking a shower. If you are not taking a shower, clean entire shoulder front, back, and side along with the armpit with a clean wet washcloth.  2. Place a quarter-sized dollop on your shoulder and rub in thoroughly, making sure to cover the front, back, and side of your shoulder, along with the armpit.  2 days before ____ AM ____ PM (Sun 04/24/23) 1 day before ____ AM ____ PM (Mon 04/25/23)  3. Do this twice a day for two days. (Last  application is the night before surgery, AFTER using the CHG soap).  4. Do NOT apply benzoyl peroxide gel on the day of surgery.

## 2023-04-25 NOTE — Progress Notes (Signed)
  Perioperative Services Pre-Admission/Anesthesia Testing    Date: 04/25/23  Name: Jay [name] MRN:   [id number]  Re: Need to transfer care  Planned Surgical Procedure(s):    Case: [id number] Date/Time: 04/26/23 1219   Procedure: Right reverse shoulder arthroplasty, biceps tenodesis (Right: Shoulder)   Anesthesia type: Choice   Pre-op diagnosis: Rotator cuff arthropathy of right shoulder M12.811   Location: [facility] OR ROOM 01 / [facility] ORS FOR ANESTHESIA GROUP   Surgeons: Tobie Priest, MD      Clinical Notes:  Patient is scheduled for the above procedure on 04/26/2023 with Dr. Priest Tobie, MD.  Patient with significantly reduced cardiac function. Additionally, patient with recurrent episodes of VT. Patient has undergone cardiac ablation in the past, however arhythmia have been refractory. He is scheduled to undergo repeat ablation in February 2025. Plans were for patient to have shoulder surgery prior to ablation, however after review of case by anesthesia, the consensus decision is that this is not in the best interest of the patient.   Dr. Stevan, MD (anesthesia) has spoken with Dr. Tobie (orthopedics) to discuss this. Additionally, anesthesia attending has spoken with the patient to discuss his concerns and increased risk of cardiovascular complication. Discussed options of waiting until after the ablation has been performed and cardiovascular function has been further optimized. Alternatively, discussed potential transfer of care to tertiary care center for the procedure. Office to discuss options with patient.   Case being cancelled here at [facility]. Surgeon aware. Will have PAT secretary contact office to have case removed from scheduled for tomorrow.   Dorise Pereyra, MSN, APRN, FNP-C, CEN [facility]  Perioperative Services Nurse Practitioner Phone: [phone] Fax: [phone] 04/25/23 9:19 AM  NOTE: This note has  been prepared using Dragon dictation software. Despite my best ability to proofread, there is always the potential that unintentional transcriptional errors may still occur from this process.

## 2023-04-26 ENCOUNTER — Ambulatory Visit: Admission: RE | Admit: 2023-04-26 | Payer: Medicare HMO | Source: Home / Self Care | Admitting: Orthopedic Surgery

## 2023-04-26 ENCOUNTER — Encounter: Admission: RE | Payer: Self-pay | Source: Home / Self Care

## 2023-04-26 SURGERY — REVERSE SHOULDER ARTHROPLASTY
Anesthesia: Choice | Site: Shoulder | Laterality: Right

## 2023-05-13 DIAGNOSIS — M12811 Other specific arthropathies, not elsewhere classified, right shoulder: Secondary | ICD-10-CM | POA: Diagnosis not present

## 2023-05-16 DIAGNOSIS — R9431 Abnormal electrocardiogram [ECG] [EKG]: Secondary | ICD-10-CM | POA: Diagnosis not present

## 2023-05-16 DIAGNOSIS — I517 Cardiomegaly: Secondary | ICD-10-CM | POA: Diagnosis not present

## 2023-05-16 DIAGNOSIS — Z7901 Long term (current) use of anticoagulants: Secondary | ICD-10-CM | POA: Diagnosis not present

## 2023-05-16 DIAGNOSIS — R918 Other nonspecific abnormal finding of lung field: Secondary | ICD-10-CM | POA: Diagnosis not present

## 2023-05-16 DIAGNOSIS — Z01810 Encounter for preprocedural cardiovascular examination: Secondary | ICD-10-CM | POA: Diagnosis not present

## 2023-05-16 DIAGNOSIS — G4733 Obstructive sleep apnea (adult) (pediatric): Secondary | ICD-10-CM | POA: Diagnosis not present

## 2023-05-16 DIAGNOSIS — I472 Ventricular tachycardia, unspecified: Secondary | ICD-10-CM | POA: Diagnosis not present

## 2023-05-16 DIAGNOSIS — I480 Paroxysmal atrial fibrillation: Secondary | ICD-10-CM | POA: Diagnosis not present

## 2023-05-17 DIAGNOSIS — I255 Ischemic cardiomyopathy: Secondary | ICD-10-CM | POA: Diagnosis not present

## 2023-05-17 DIAGNOSIS — I483 Typical atrial flutter: Secondary | ICD-10-CM | POA: Diagnosis not present

## 2023-05-17 DIAGNOSIS — I5022 Chronic systolic (congestive) heart failure: Secondary | ICD-10-CM | POA: Diagnosis not present

## 2023-05-17 DIAGNOSIS — I4719 Other supraventricular tachycardia: Secondary | ICD-10-CM | POA: Diagnosis not present

## 2023-05-17 DIAGNOSIS — I440 Atrioventricular block, first degree: Secondary | ICD-10-CM | POA: Diagnosis not present

## 2023-05-17 DIAGNOSIS — I4819 Other persistent atrial fibrillation: Secondary | ICD-10-CM | POA: Diagnosis not present

## 2023-05-17 DIAGNOSIS — R9431 Abnormal electrocardiogram [ECG] [EKG]: Secondary | ICD-10-CM | POA: Diagnosis not present

## 2023-05-17 DIAGNOSIS — I252 Old myocardial infarction: Secondary | ICD-10-CM | POA: Diagnosis not present

## 2023-05-17 DIAGNOSIS — Z95810 Presence of automatic (implantable) cardiac defibrillator: Secondary | ICD-10-CM | POA: Diagnosis not present

## 2023-06-21 DIAGNOSIS — I110 Hypertensive heart disease with heart failure: Secondary | ICD-10-CM | POA: Diagnosis not present

## 2023-06-21 DIAGNOSIS — I480 Paroxysmal atrial fibrillation: Secondary | ICD-10-CM | POA: Diagnosis not present

## 2023-06-21 DIAGNOSIS — M12811 Other specific arthropathies, not elsewhere classified, right shoulder: Secondary | ICD-10-CM | POA: Diagnosis not present

## 2023-06-21 DIAGNOSIS — I252 Old myocardial infarction: Secondary | ICD-10-CM | POA: Diagnosis not present

## 2023-06-21 DIAGNOSIS — I5022 Chronic systolic (congestive) heart failure: Secondary | ICD-10-CM | POA: Diagnosis not present

## 2023-06-21 DIAGNOSIS — E1169 Type 2 diabetes mellitus with other specified complication: Secondary | ICD-10-CM | POA: Diagnosis not present

## 2023-06-21 DIAGNOSIS — G4733 Obstructive sleep apnea (adult) (pediatric): Secondary | ICD-10-CM | POA: Diagnosis not present

## 2023-06-21 DIAGNOSIS — I10 Essential (primary) hypertension: Secondary | ICD-10-CM | POA: Diagnosis not present

## 2023-06-21 DIAGNOSIS — Z8679 Personal history of other diseases of the circulatory system: Secondary | ICD-10-CM | POA: Diagnosis not present

## 2023-06-21 DIAGNOSIS — Z01818 Encounter for other preprocedural examination: Secondary | ICD-10-CM | POA: Diagnosis not present

## 2023-06-21 DIAGNOSIS — I2510 Atherosclerotic heart disease of native coronary artery without angina pectoris: Secondary | ICD-10-CM | POA: Diagnosis not present

## 2023-06-21 DIAGNOSIS — M75101 Unspecified rotator cuff tear or rupture of right shoulder, not specified as traumatic: Secondary | ICD-10-CM | POA: Diagnosis not present

## 2023-06-23 DIAGNOSIS — Z9889 Other specified postprocedural states: Secondary | ICD-10-CM | POA: Diagnosis not present

## 2023-06-23 DIAGNOSIS — I10 Essential (primary) hypertension: Secondary | ICD-10-CM | POA: Diagnosis not present

## 2023-06-23 DIAGNOSIS — I5022 Chronic systolic (congestive) heart failure: Secondary | ICD-10-CM | POA: Diagnosis not present

## 2023-06-23 DIAGNOSIS — Z95810 Presence of automatic (implantable) cardiac defibrillator: Secondary | ICD-10-CM | POA: Diagnosis not present

## 2023-06-23 DIAGNOSIS — R0602 Shortness of breath: Secondary | ICD-10-CM | POA: Diagnosis not present

## 2023-06-23 DIAGNOSIS — I480 Paroxysmal atrial fibrillation: Secondary | ICD-10-CM | POA: Diagnosis not present

## 2023-06-23 DIAGNOSIS — E782 Mixed hyperlipidemia: Secondary | ICD-10-CM | POA: Diagnosis not present

## 2023-06-23 DIAGNOSIS — Z8679 Personal history of other diseases of the circulatory system: Secondary | ICD-10-CM | POA: Diagnosis not present

## 2023-06-23 DIAGNOSIS — Z23 Encounter for immunization: Secondary | ICD-10-CM | POA: Diagnosis not present

## 2023-06-24 ENCOUNTER — Encounter: Payer: Self-pay | Admitting: Urgent Care

## 2023-06-24 NOTE — Progress Notes (Deleted)
 Error

## 2023-06-24 NOTE — Addendum Note (Signed)
 Encounter addended by: Verlee Monte, NP on: 06/24/2023 10:46 AM  Actions taken: Clinical Note Signed, Delete clinical note

## 2023-06-24 NOTE — Progress Notes (Signed)
  Perioperative Services Pre-Admission/Anesthesia Testing    Date: 06/24/23  Name: Jay [name] MRN:   [id number]  Re: Plans for surgery  Planned Surgical Procedure(s):    Case: [id number] Date: 04/26/23   Procedure: Right reverse shoulder arthroplasty, biceps tenodesis (Right: Shoulder) (canceled)   Anesthesia type: Choice   Pre-op diagnosis: Rotator cuff arthropathy of right shoulder M12.811   Location: [facility] ORS FOR ANESTHESIA GROUP   Surgeons: Signa Kell, MD   Clinical Notes:  Patient originally scheduled for the above procedure on 04/26/2023 with Dr. Signa Kell, MD. given significant cardiovascular history and pending procedures at [facility], the decision was made to cancel case here at [facility] in favor of having patient transferred to tertiary care center for higher level of care needs.  Since case was originally canceled, patient has undergone cardioversion and VT ablation at [facility].  Patient is on chronic oral anticoagulation therapy following his ablation.  He followed up with cardiology Darrold Junker, MD) on 06/23/2023; notes reviewed.  Patient doing well following his procedure.  Cardiology feels that if patient has improved clinically overall since the aforementioned procedures.  With that in mind, following a 60-day uninterrupted DOAC course after VT ablation, patient has been cleared to proceed with shoulder surgery as planned.    Above information discussed with attending anesthesiologist on call Ronni Rumble, MD).  Information regarding patient's significant cardiovascular history and procedures discussed with anesthesia.  Reviewed prior conversations between myself and Dr. Randa Ngo.  We discussed that Dr. Allena Katz is asking for anesthesia to review case again for consideration here at our facility given subjective patient improvement.  Cardiology notes from [facility] and [facility] reviewed by Dr. Ronni Rumble.  Following review  and careful consideration, MD advising that she would now feel more comfortable with patient's case being performed here at our facility.  Information shared with Dr. Allena Katz.  Discussed that earliest surgical date could be 07/15/2023 based on post ablation recommendations from both EP and general cardiology.  MD appreciative of anesthesia involvement and assisting in the safe provision of care for this patient.  MD to touch base with patient and let him know that since he is doing well clinically following his cardiac procedures, we are now able to proceed with his anesthetic care here at [facility].  MD to have his office rescheduled case.  Will plan on placing a formal anesthesia consult note for review by anesthetic teams prior to case.  Quentin Mulling, MSN, APRN, FNP-C, CEN [facility]  Perioperative Services Nurse Practitioner Phone: [phone] Fax: [phone] 06/24/23 10:28 AM  NOTE: This note has been prepared using Dragon dictation software. Despite my best ability to proofread, there is always the potential that unintentional transcriptional errors may still occur from this process.

## 2023-07-19 DIAGNOSIS — I252 Old myocardial infarction: Secondary | ICD-10-CM | POA: Diagnosis not present

## 2023-07-19 DIAGNOSIS — M75121 Complete rotator cuff tear or rupture of right shoulder, not specified as traumatic: Secondary | ICD-10-CM | POA: Diagnosis not present

## 2023-07-19 DIAGNOSIS — E785 Hyperlipidemia, unspecified: Secondary | ICD-10-CM | POA: Diagnosis not present

## 2023-07-19 DIAGNOSIS — F17290 Nicotine dependence, other tobacco product, uncomplicated: Secondary | ICD-10-CM | POA: Diagnosis not present

## 2023-07-19 DIAGNOSIS — I110 Hypertensive heart disease with heart failure: Secondary | ICD-10-CM | POA: Diagnosis not present

## 2023-07-19 DIAGNOSIS — I2510 Atherosclerotic heart disease of native coronary artery without angina pectoris: Secondary | ICD-10-CM | POA: Diagnosis not present

## 2023-07-19 DIAGNOSIS — M25711 Osteophyte, right shoulder: Secondary | ICD-10-CM | POA: Diagnosis not present

## 2023-07-19 DIAGNOSIS — G4730 Sleep apnea, unspecified: Secondary | ICD-10-CM | POA: Diagnosis not present

## 2023-07-19 DIAGNOSIS — I10 Essential (primary) hypertension: Secondary | ICD-10-CM | POA: Diagnosis not present

## 2023-07-19 DIAGNOSIS — I480 Paroxysmal atrial fibrillation: Secondary | ICD-10-CM | POA: Diagnosis not present

## 2023-07-19 DIAGNOSIS — M75101 Unspecified rotator cuff tear or rupture of right shoulder, not specified as traumatic: Secondary | ICD-10-CM | POA: Diagnosis not present

## 2023-07-19 DIAGNOSIS — M12811 Other specific arthropathies, not elsewhere classified, right shoulder: Secondary | ICD-10-CM | POA: Diagnosis not present

## 2023-07-19 DIAGNOSIS — Z8679 Personal history of other diseases of the circulatory system: Secondary | ICD-10-CM | POA: Diagnosis not present

## 2023-07-19 DIAGNOSIS — G8918 Other acute postprocedural pain: Secondary | ICD-10-CM | POA: Diagnosis not present

## 2023-07-19 DIAGNOSIS — E1169 Type 2 diabetes mellitus with other specified complication: Secondary | ICD-10-CM | POA: Diagnosis not present

## 2023-07-19 DIAGNOSIS — Z95810 Presence of automatic (implantable) cardiac defibrillator: Secondary | ICD-10-CM | POA: Diagnosis not present

## 2023-07-19 DIAGNOSIS — Z96611 Presence of right artificial shoulder joint: Secondary | ICD-10-CM | POA: Diagnosis not present

## 2023-07-19 DIAGNOSIS — I5022 Chronic systolic (congestive) heart failure: Secondary | ICD-10-CM | POA: Diagnosis not present

## 2023-07-19 DIAGNOSIS — Z9889 Other specified postprocedural states: Secondary | ICD-10-CM | POA: Diagnosis not present

## 2023-07-25 DIAGNOSIS — I252 Old myocardial infarction: Secondary | ICD-10-CM | POA: Diagnosis not present

## 2023-07-25 DIAGNOSIS — I5022 Chronic systolic (congestive) heart failure: Secondary | ICD-10-CM | POA: Diagnosis not present

## 2023-07-25 DIAGNOSIS — Z471 Aftercare following joint replacement surgery: Secondary | ICD-10-CM | POA: Diagnosis not present

## 2023-07-25 DIAGNOSIS — I2510 Atherosclerotic heart disease of native coronary artery without angina pectoris: Secondary | ICD-10-CM | POA: Diagnosis not present

## 2023-07-25 DIAGNOSIS — I440 Atrioventricular block, first degree: Secondary | ICD-10-CM | POA: Diagnosis not present

## 2023-07-25 DIAGNOSIS — I483 Typical atrial flutter: Secondary | ICD-10-CM | POA: Diagnosis not present

## 2023-07-25 DIAGNOSIS — Z96611 Presence of right artificial shoulder joint: Secondary | ICD-10-CM | POA: Diagnosis not present

## 2023-07-25 DIAGNOSIS — I480 Paroxysmal atrial fibrillation: Secondary | ICD-10-CM | POA: Diagnosis not present

## 2023-07-25 DIAGNOSIS — I110 Hypertensive heart disease with heart failure: Secondary | ICD-10-CM | POA: Diagnosis not present

## 2023-07-29 DIAGNOSIS — Z96611 Presence of right artificial shoulder joint: Secondary | ICD-10-CM | POA: Diagnosis not present

## 2023-07-29 DIAGNOSIS — I483 Typical atrial flutter: Secondary | ICD-10-CM | POA: Diagnosis not present

## 2023-07-29 DIAGNOSIS — Z471 Aftercare following joint replacement surgery: Secondary | ICD-10-CM | POA: Diagnosis not present

## 2023-07-29 DIAGNOSIS — I480 Paroxysmal atrial fibrillation: Secondary | ICD-10-CM | POA: Diagnosis not present

## 2023-07-29 DIAGNOSIS — I440 Atrioventricular block, first degree: Secondary | ICD-10-CM | POA: Diagnosis not present

## 2023-07-29 DIAGNOSIS — I110 Hypertensive heart disease with heart failure: Secondary | ICD-10-CM | POA: Diagnosis not present

## 2023-07-29 DIAGNOSIS — I252 Old myocardial infarction: Secondary | ICD-10-CM | POA: Diagnosis not present

## 2023-07-29 DIAGNOSIS — I5022 Chronic systolic (congestive) heart failure: Secondary | ICD-10-CM | POA: Diagnosis not present

## 2023-07-29 DIAGNOSIS — I2510 Atherosclerotic heart disease of native coronary artery without angina pectoris: Secondary | ICD-10-CM | POA: Diagnosis not present

## 2023-08-01 DIAGNOSIS — Z96611 Presence of right artificial shoulder joint: Secondary | ICD-10-CM | POA: Diagnosis not present

## 2023-08-02 DIAGNOSIS — I110 Hypertensive heart disease with heart failure: Secondary | ICD-10-CM | POA: Diagnosis not present

## 2023-08-02 DIAGNOSIS — I2510 Atherosclerotic heart disease of native coronary artery without angina pectoris: Secondary | ICD-10-CM | POA: Diagnosis not present

## 2023-08-02 DIAGNOSIS — I252 Old myocardial infarction: Secondary | ICD-10-CM | POA: Diagnosis not present

## 2023-08-02 DIAGNOSIS — Z96611 Presence of right artificial shoulder joint: Secondary | ICD-10-CM | POA: Diagnosis not present

## 2023-08-02 DIAGNOSIS — I483 Typical atrial flutter: Secondary | ICD-10-CM | POA: Diagnosis not present

## 2023-08-02 DIAGNOSIS — I440 Atrioventricular block, first degree: Secondary | ICD-10-CM | POA: Diagnosis not present

## 2023-08-02 DIAGNOSIS — I480 Paroxysmal atrial fibrillation: Secondary | ICD-10-CM | POA: Diagnosis not present

## 2023-08-02 DIAGNOSIS — I5022 Chronic systolic (congestive) heart failure: Secondary | ICD-10-CM | POA: Diagnosis not present

## 2023-08-02 DIAGNOSIS — Z471 Aftercare following joint replacement surgery: Secondary | ICD-10-CM | POA: Diagnosis not present

## 2023-08-04 DIAGNOSIS — I2510 Atherosclerotic heart disease of native coronary artery without angina pectoris: Secondary | ICD-10-CM | POA: Diagnosis not present

## 2023-08-04 DIAGNOSIS — I440 Atrioventricular block, first degree: Secondary | ICD-10-CM | POA: Diagnosis not present

## 2023-08-04 DIAGNOSIS — I252 Old myocardial infarction: Secondary | ICD-10-CM | POA: Diagnosis not present

## 2023-08-04 DIAGNOSIS — I5022 Chronic systolic (congestive) heart failure: Secondary | ICD-10-CM | POA: Diagnosis not present

## 2023-08-04 DIAGNOSIS — Z471 Aftercare following joint replacement surgery: Secondary | ICD-10-CM | POA: Diagnosis not present

## 2023-08-04 DIAGNOSIS — I110 Hypertensive heart disease with heart failure: Secondary | ICD-10-CM | POA: Diagnosis not present

## 2023-08-04 DIAGNOSIS — I480 Paroxysmal atrial fibrillation: Secondary | ICD-10-CM | POA: Diagnosis not present

## 2023-08-04 DIAGNOSIS — Z96611 Presence of right artificial shoulder joint: Secondary | ICD-10-CM | POA: Diagnosis not present

## 2023-08-04 DIAGNOSIS — I483 Typical atrial flutter: Secondary | ICD-10-CM | POA: Diagnosis not present

## 2023-08-09 DIAGNOSIS — I252 Old myocardial infarction: Secondary | ICD-10-CM | POA: Diagnosis not present

## 2023-08-09 DIAGNOSIS — I5022 Chronic systolic (congestive) heart failure: Secondary | ICD-10-CM | POA: Diagnosis not present

## 2023-08-09 DIAGNOSIS — Z471 Aftercare following joint replacement surgery: Secondary | ICD-10-CM | POA: Diagnosis not present

## 2023-08-09 DIAGNOSIS — I440 Atrioventricular block, first degree: Secondary | ICD-10-CM | POA: Diagnosis not present

## 2023-08-09 DIAGNOSIS — I2510 Atherosclerotic heart disease of native coronary artery without angina pectoris: Secondary | ICD-10-CM | POA: Diagnosis not present

## 2023-08-09 DIAGNOSIS — I110 Hypertensive heart disease with heart failure: Secondary | ICD-10-CM | POA: Diagnosis not present

## 2023-08-09 DIAGNOSIS — I483 Typical atrial flutter: Secondary | ICD-10-CM | POA: Diagnosis not present

## 2023-08-09 DIAGNOSIS — I480 Paroxysmal atrial fibrillation: Secondary | ICD-10-CM | POA: Diagnosis not present

## 2023-08-09 DIAGNOSIS — Z96611 Presence of right artificial shoulder joint: Secondary | ICD-10-CM | POA: Diagnosis not present

## 2023-08-10 DIAGNOSIS — I2510 Atherosclerotic heart disease of native coronary artery without angina pectoris: Secondary | ICD-10-CM | POA: Diagnosis not present

## 2023-08-10 DIAGNOSIS — Z471 Aftercare following joint replacement surgery: Secondary | ICD-10-CM | POA: Diagnosis not present

## 2023-08-10 DIAGNOSIS — I483 Typical atrial flutter: Secondary | ICD-10-CM | POA: Diagnosis not present

## 2023-08-10 DIAGNOSIS — I440 Atrioventricular block, first degree: Secondary | ICD-10-CM | POA: Diagnosis not present

## 2023-08-10 DIAGNOSIS — I110 Hypertensive heart disease with heart failure: Secondary | ICD-10-CM | POA: Diagnosis not present

## 2023-08-10 DIAGNOSIS — Z96611 Presence of right artificial shoulder joint: Secondary | ICD-10-CM | POA: Diagnosis not present

## 2023-08-10 DIAGNOSIS — I252 Old myocardial infarction: Secondary | ICD-10-CM | POA: Diagnosis not present

## 2023-08-10 DIAGNOSIS — I5022 Chronic systolic (congestive) heart failure: Secondary | ICD-10-CM | POA: Diagnosis not present

## 2023-08-10 DIAGNOSIS — I480 Paroxysmal atrial fibrillation: Secondary | ICD-10-CM | POA: Diagnosis not present

## 2023-08-17 DIAGNOSIS — Z471 Aftercare following joint replacement surgery: Secondary | ICD-10-CM | POA: Diagnosis not present

## 2023-08-17 DIAGNOSIS — I110 Hypertensive heart disease with heart failure: Secondary | ICD-10-CM | POA: Diagnosis not present

## 2023-08-17 DIAGNOSIS — I440 Atrioventricular block, first degree: Secondary | ICD-10-CM | POA: Diagnosis not present

## 2023-08-17 DIAGNOSIS — I252 Old myocardial infarction: Secondary | ICD-10-CM | POA: Diagnosis not present

## 2023-08-17 DIAGNOSIS — I480 Paroxysmal atrial fibrillation: Secondary | ICD-10-CM | POA: Diagnosis not present

## 2023-08-17 DIAGNOSIS — I2510 Atherosclerotic heart disease of native coronary artery without angina pectoris: Secondary | ICD-10-CM | POA: Diagnosis not present

## 2023-08-17 DIAGNOSIS — Z96611 Presence of right artificial shoulder joint: Secondary | ICD-10-CM | POA: Diagnosis not present

## 2023-08-17 DIAGNOSIS — I5022 Chronic systolic (congestive) heart failure: Secondary | ICD-10-CM | POA: Diagnosis not present

## 2023-08-17 DIAGNOSIS — I483 Typical atrial flutter: Secondary | ICD-10-CM | POA: Diagnosis not present

## 2023-08-23 DIAGNOSIS — I420 Dilated cardiomyopathy: Secondary | ICD-10-CM | POA: Diagnosis not present

## 2023-08-24 DIAGNOSIS — I480 Paroxysmal atrial fibrillation: Secondary | ICD-10-CM | POA: Diagnosis not present

## 2023-08-24 DIAGNOSIS — I2510 Atherosclerotic heart disease of native coronary artery without angina pectoris: Secondary | ICD-10-CM | POA: Diagnosis not present

## 2023-08-24 DIAGNOSIS — I5022 Chronic systolic (congestive) heart failure: Secondary | ICD-10-CM | POA: Diagnosis not present

## 2023-08-24 DIAGNOSIS — I252 Old myocardial infarction: Secondary | ICD-10-CM | POA: Diagnosis not present

## 2023-08-24 DIAGNOSIS — I110 Hypertensive heart disease with heart failure: Secondary | ICD-10-CM | POA: Diagnosis not present

## 2023-08-24 DIAGNOSIS — I440 Atrioventricular block, first degree: Secondary | ICD-10-CM | POA: Diagnosis not present

## 2023-08-24 DIAGNOSIS — Z96611 Presence of right artificial shoulder joint: Secondary | ICD-10-CM | POA: Diagnosis not present

## 2023-08-24 DIAGNOSIS — Z471 Aftercare following joint replacement surgery: Secondary | ICD-10-CM | POA: Diagnosis not present

## 2023-08-24 DIAGNOSIS — I483 Typical atrial flutter: Secondary | ICD-10-CM | POA: Diagnosis not present

## 2023-08-29 DIAGNOSIS — Z4889 Encounter for other specified surgical aftercare: Secondary | ICD-10-CM | POA: Diagnosis not present

## 2023-08-29 DIAGNOSIS — Z96611 Presence of right artificial shoulder joint: Secondary | ICD-10-CM | POA: Diagnosis not present

## 2023-08-29 DIAGNOSIS — M25511 Pain in right shoulder: Secondary | ICD-10-CM | POA: Diagnosis not present

## 2023-09-07 DIAGNOSIS — Z96611 Presence of right artificial shoulder joint: Secondary | ICD-10-CM | POA: Diagnosis not present

## 2023-09-07 DIAGNOSIS — M25511 Pain in right shoulder: Secondary | ICD-10-CM | POA: Diagnosis not present

## 2023-09-14 DIAGNOSIS — Z96611 Presence of right artificial shoulder joint: Secondary | ICD-10-CM | POA: Diagnosis not present

## 2023-09-14 DIAGNOSIS — M25511 Pain in right shoulder: Secondary | ICD-10-CM | POA: Diagnosis not present

## 2023-09-19 DIAGNOSIS — Z96611 Presence of right artificial shoulder joint: Secondary | ICD-10-CM | POA: Diagnosis not present

## 2023-09-19 DIAGNOSIS — M25511 Pain in right shoulder: Secondary | ICD-10-CM | POA: Diagnosis not present

## 2023-09-20 DIAGNOSIS — E782 Mixed hyperlipidemia: Secondary | ICD-10-CM | POA: Diagnosis not present

## 2023-09-20 DIAGNOSIS — E1169 Type 2 diabetes mellitus with other specified complication: Secondary | ICD-10-CM | POA: Diagnosis not present

## 2023-09-20 DIAGNOSIS — I10 Essential (primary) hypertension: Secondary | ICD-10-CM | POA: Diagnosis not present

## 2023-09-20 DIAGNOSIS — E785 Hyperlipidemia, unspecified: Secondary | ICD-10-CM | POA: Diagnosis not present

## 2023-09-26 DIAGNOSIS — M25511 Pain in right shoulder: Secondary | ICD-10-CM | POA: Diagnosis not present

## 2023-09-26 DIAGNOSIS — Z96611 Presence of right artificial shoulder joint: Secondary | ICD-10-CM | POA: Diagnosis not present

## 2023-09-27 DIAGNOSIS — E782 Mixed hyperlipidemia: Secondary | ICD-10-CM | POA: Diagnosis not present

## 2023-09-27 DIAGNOSIS — E119 Type 2 diabetes mellitus without complications: Secondary | ICD-10-CM | POA: Diagnosis not present

## 2023-09-27 DIAGNOSIS — I10 Essential (primary) hypertension: Secondary | ICD-10-CM | POA: Diagnosis not present

## 2023-09-27 DIAGNOSIS — R1031 Right lower quadrant pain: Secondary | ICD-10-CM | POA: Diagnosis not present

## 2023-09-27 DIAGNOSIS — Z862 Personal history of diseases of the blood and blood-forming organs and certain disorders involving the immune mechanism: Secondary | ICD-10-CM | POA: Diagnosis not present

## 2023-10-01 DIAGNOSIS — S91332A Puncture wound without foreign body, left foot, initial encounter: Secondary | ICD-10-CM | POA: Diagnosis not present

## 2023-10-01 DIAGNOSIS — D6832 Hemorrhagic disorder due to extrinsic circulating anticoagulants: Secondary | ICD-10-CM | POA: Diagnosis not present

## 2023-10-03 DIAGNOSIS — Z96611 Presence of right artificial shoulder joint: Secondary | ICD-10-CM | POA: Diagnosis not present

## 2023-10-03 DIAGNOSIS — M25511 Pain in right shoulder: Secondary | ICD-10-CM | POA: Diagnosis not present

## 2023-10-04 DIAGNOSIS — Z9889 Other specified postprocedural states: Secondary | ICD-10-CM | POA: Diagnosis not present

## 2023-10-04 DIAGNOSIS — I5022 Chronic systolic (congestive) heart failure: Secondary | ICD-10-CM | POA: Diagnosis not present

## 2023-10-04 DIAGNOSIS — I480 Paroxysmal atrial fibrillation: Secondary | ICD-10-CM | POA: Diagnosis not present

## 2023-10-04 DIAGNOSIS — Z95810 Presence of automatic (implantable) cardiac defibrillator: Secondary | ICD-10-CM | POA: Diagnosis not present

## 2023-10-04 DIAGNOSIS — I252 Old myocardial infarction: Secondary | ICD-10-CM | POA: Diagnosis not present

## 2023-10-04 DIAGNOSIS — I2510 Atherosclerotic heart disease of native coronary artery without angina pectoris: Secondary | ICD-10-CM | POA: Diagnosis not present

## 2023-10-04 DIAGNOSIS — Z8679 Personal history of other diseases of the circulatory system: Secondary | ICD-10-CM | POA: Diagnosis not present

## 2023-10-04 DIAGNOSIS — G4733 Obstructive sleep apnea (adult) (pediatric): Secondary | ICD-10-CM | POA: Diagnosis not present

## 2023-10-04 DIAGNOSIS — I10 Essential (primary) hypertension: Secondary | ICD-10-CM | POA: Diagnosis not present

## 2023-10-10 DIAGNOSIS — Z96611 Presence of right artificial shoulder joint: Secondary | ICD-10-CM | POA: Diagnosis not present

## 2023-10-10 DIAGNOSIS — S91312A Laceration without foreign body, left foot, initial encounter: Secondary | ICD-10-CM | POA: Diagnosis not present

## 2023-10-10 DIAGNOSIS — E1169 Type 2 diabetes mellitus with other specified complication: Secondary | ICD-10-CM | POA: Diagnosis not present

## 2023-10-10 DIAGNOSIS — Z4889 Encounter for other specified surgical aftercare: Secondary | ICD-10-CM | POA: Diagnosis not present

## 2023-10-10 DIAGNOSIS — M25511 Pain in right shoulder: Secondary | ICD-10-CM | POA: Diagnosis not present

## 2023-10-10 DIAGNOSIS — E785 Hyperlipidemia, unspecified: Secondary | ICD-10-CM | POA: Diagnosis not present

## 2023-10-17 DIAGNOSIS — S91312A Laceration without foreign body, left foot, initial encounter: Secondary | ICD-10-CM | POA: Diagnosis not present

## 2023-10-17 DIAGNOSIS — M25511 Pain in right shoulder: Secondary | ICD-10-CM | POA: Diagnosis not present

## 2023-10-17 DIAGNOSIS — E1169 Type 2 diabetes mellitus with other specified complication: Secondary | ICD-10-CM | POA: Diagnosis not present

## 2023-10-17 DIAGNOSIS — E785 Hyperlipidemia, unspecified: Secondary | ICD-10-CM | POA: Diagnosis not present

## 2023-10-17 DIAGNOSIS — Z96611 Presence of right artificial shoulder joint: Secondary | ICD-10-CM | POA: Diagnosis not present

## 2023-10-24 DIAGNOSIS — Z96611 Presence of right artificial shoulder joint: Secondary | ICD-10-CM | POA: Diagnosis not present

## 2023-10-24 DIAGNOSIS — M25511 Pain in right shoulder: Secondary | ICD-10-CM | POA: Diagnosis not present

## 2023-10-31 DIAGNOSIS — Z96611 Presence of right artificial shoulder joint: Secondary | ICD-10-CM | POA: Diagnosis not present

## 2023-10-31 DIAGNOSIS — M25511 Pain in right shoulder: Secondary | ICD-10-CM | POA: Diagnosis not present

## 2023-11-07 DIAGNOSIS — L720 Epidermal cyst: Secondary | ICD-10-CM | POA: Diagnosis not present

## 2023-11-07 DIAGNOSIS — Z85828 Personal history of other malignant neoplasm of skin: Secondary | ICD-10-CM | POA: Diagnosis not present

## 2023-11-07 DIAGNOSIS — L578 Other skin changes due to chronic exposure to nonionizing radiation: Secondary | ICD-10-CM | POA: Diagnosis not present

## 2023-11-07 DIAGNOSIS — M25511 Pain in right shoulder: Secondary | ICD-10-CM | POA: Diagnosis not present

## 2023-11-07 DIAGNOSIS — Z872 Personal history of diseases of the skin and subcutaneous tissue: Secondary | ICD-10-CM | POA: Diagnosis not present

## 2023-11-07 DIAGNOSIS — L570 Actinic keratosis: Secondary | ICD-10-CM | POA: Diagnosis not present

## 2023-11-07 DIAGNOSIS — Z96611 Presence of right artificial shoulder joint: Secondary | ICD-10-CM | POA: Diagnosis not present

## 2023-11-09 DIAGNOSIS — I5022 Chronic systolic (congestive) heart failure: Secondary | ICD-10-CM | POA: Diagnosis not present

## 2023-11-09 DIAGNOSIS — I480 Paroxysmal atrial fibrillation: Secondary | ICD-10-CM | POA: Diagnosis not present

## 2023-11-09 DIAGNOSIS — Z66 Do not resuscitate: Secondary | ICD-10-CM | POA: Diagnosis not present

## 2023-11-09 DIAGNOSIS — Z9889 Other specified postprocedural states: Secondary | ICD-10-CM | POA: Diagnosis not present

## 2023-11-09 DIAGNOSIS — R000 Tachycardia, unspecified: Secondary | ICD-10-CM | POA: Diagnosis not present

## 2023-11-09 DIAGNOSIS — Z95810 Presence of automatic (implantable) cardiac defibrillator: Secondary | ICD-10-CM | POA: Diagnosis not present

## 2023-11-09 DIAGNOSIS — I252 Old myocardial infarction: Secondary | ICD-10-CM | POA: Diagnosis not present

## 2023-11-09 DIAGNOSIS — I10 Essential (primary) hypertension: Secondary | ICD-10-CM | POA: Diagnosis not present

## 2023-11-09 DIAGNOSIS — I2510 Atherosclerotic heart disease of native coronary artery without angina pectoris: Secondary | ICD-10-CM | POA: Diagnosis not present

## 2023-11-14 DIAGNOSIS — Z96611 Presence of right artificial shoulder joint: Secondary | ICD-10-CM | POA: Diagnosis not present

## 2023-11-14 DIAGNOSIS — M25511 Pain in right shoulder: Secondary | ICD-10-CM | POA: Diagnosis not present

## 2023-11-21 DIAGNOSIS — Z96611 Presence of right artificial shoulder joint: Secondary | ICD-10-CM | POA: Diagnosis not present

## 2023-11-21 DIAGNOSIS — M25511 Pain in right shoulder: Secondary | ICD-10-CM | POA: Diagnosis not present

## 2023-11-23 DIAGNOSIS — I420 Dilated cardiomyopathy: Secondary | ICD-10-CM | POA: Diagnosis not present

## 2023-11-23 DIAGNOSIS — Z95810 Presence of automatic (implantable) cardiac defibrillator: Secondary | ICD-10-CM | POA: Diagnosis not present

## 2023-11-28 DIAGNOSIS — L720 Epidermal cyst: Secondary | ICD-10-CM | POA: Diagnosis not present

## 2023-11-30 DIAGNOSIS — I480 Paroxysmal atrial fibrillation: Secondary | ICD-10-CM | POA: Diagnosis not present

## 2023-12-08 DIAGNOSIS — I10 Essential (primary) hypertension: Secondary | ICD-10-CM | POA: Diagnosis not present

## 2023-12-08 DIAGNOSIS — I480 Paroxysmal atrial fibrillation: Secondary | ICD-10-CM | POA: Diagnosis not present

## 2023-12-08 DIAGNOSIS — G4733 Obstructive sleep apnea (adult) (pediatric): Secondary | ICD-10-CM | POA: Diagnosis not present

## 2023-12-08 DIAGNOSIS — I2510 Atherosclerotic heart disease of native coronary artery without angina pectoris: Secondary | ICD-10-CM | POA: Diagnosis not present

## 2023-12-08 DIAGNOSIS — I8391 Asymptomatic varicose veins of right lower extremity: Secondary | ICD-10-CM | POA: Diagnosis not present

## 2023-12-08 DIAGNOSIS — I252 Old myocardial infarction: Secondary | ICD-10-CM | POA: Diagnosis not present

## 2023-12-08 DIAGNOSIS — Z9889 Other specified postprocedural states: Secondary | ICD-10-CM | POA: Diagnosis not present

## 2023-12-08 DIAGNOSIS — Z95810 Presence of automatic (implantable) cardiac defibrillator: Secondary | ICD-10-CM | POA: Diagnosis not present

## 2023-12-08 DIAGNOSIS — I5022 Chronic systolic (congestive) heart failure: Secondary | ICD-10-CM | POA: Diagnosis not present

## 2023-12-13 DIAGNOSIS — I480 Paroxysmal atrial fibrillation: Secondary | ICD-10-CM | POA: Diagnosis not present

## 2023-12-14 DIAGNOSIS — Z4889 Encounter for other specified surgical aftercare: Secondary | ICD-10-CM | POA: Diagnosis not present

## 2023-12-14 DIAGNOSIS — Z96611 Presence of right artificial shoulder joint: Secondary | ICD-10-CM | POA: Diagnosis not present

## 2024-01-26 DIAGNOSIS — F17220 Nicotine dependence, chewing tobacco, uncomplicated: Secondary | ICD-10-CM | POA: Diagnosis not present

## 2024-01-26 DIAGNOSIS — I480 Paroxysmal atrial fibrillation: Secondary | ICD-10-CM | POA: Diagnosis not present

## 2024-01-26 DIAGNOSIS — I493 Ventricular premature depolarization: Secondary | ICD-10-CM | POA: Diagnosis not present

## 2024-01-26 DIAGNOSIS — E1169 Type 2 diabetes mellitus with other specified complication: Secondary | ICD-10-CM | POA: Diagnosis not present

## 2024-01-26 DIAGNOSIS — I2510 Atherosclerotic heart disease of native coronary artery without angina pectoris: Secondary | ICD-10-CM | POA: Diagnosis not present

## 2024-01-26 DIAGNOSIS — Z7901 Long term (current) use of anticoagulants: Secondary | ICD-10-CM | POA: Diagnosis not present

## 2024-01-26 DIAGNOSIS — I110 Hypertensive heart disease with heart failure: Secondary | ICD-10-CM | POA: Diagnosis not present

## 2024-01-26 DIAGNOSIS — I5022 Chronic systolic (congestive) heart failure: Secondary | ICD-10-CM | POA: Diagnosis not present

## 2024-01-26 DIAGNOSIS — G4733 Obstructive sleep apnea (adult) (pediatric): Secondary | ICD-10-CM | POA: Diagnosis not present

## 2024-01-26 DIAGNOSIS — I4892 Unspecified atrial flutter: Secondary | ICD-10-CM | POA: Diagnosis not present

## 2024-01-26 DIAGNOSIS — E785 Hyperlipidemia, unspecified: Secondary | ICD-10-CM | POA: Diagnosis not present

## 2024-01-26 DIAGNOSIS — Z950 Presence of cardiac pacemaker: Secondary | ICD-10-CM | POA: Diagnosis not present

## 2024-03-13 DIAGNOSIS — I2510 Atherosclerotic heart disease of native coronary artery without angina pectoris: Secondary | ICD-10-CM | POA: Diagnosis not present

## 2024-03-13 DIAGNOSIS — Z9889 Other specified postprocedural states: Secondary | ICD-10-CM | POA: Diagnosis not present

## 2024-03-13 DIAGNOSIS — I10 Essential (primary) hypertension: Secondary | ICD-10-CM | POA: Diagnosis not present

## 2024-03-13 DIAGNOSIS — I252 Old myocardial infarction: Secondary | ICD-10-CM | POA: Diagnosis not present

## 2024-03-13 DIAGNOSIS — I493 Ventricular premature depolarization: Secondary | ICD-10-CM | POA: Diagnosis not present

## 2024-03-13 DIAGNOSIS — Z23 Encounter for immunization: Secondary | ICD-10-CM | POA: Diagnosis not present

## 2024-03-13 DIAGNOSIS — I480 Paroxysmal atrial fibrillation: Secondary | ICD-10-CM | POA: Diagnosis not present

## 2024-03-13 DIAGNOSIS — Z95810 Presence of automatic (implantable) cardiac defibrillator: Secondary | ICD-10-CM | POA: Diagnosis not present

## 2024-03-13 DIAGNOSIS — I5022 Chronic systolic (congestive) heart failure: Secondary | ICD-10-CM | POA: Diagnosis not present

## 2024-05-04 ENCOUNTER — Other Ambulatory Visit: Payer: Self-pay | Admitting: Family Medicine

## 2024-05-04 DIAGNOSIS — R911 Solitary pulmonary nodule: Secondary | ICD-10-CM

## 2024-05-10 ENCOUNTER — Ambulatory Visit: Admitting: Anesthesiology

## 2024-05-10 ENCOUNTER — Ambulatory Visit
Admission: RE | Admit: 2024-05-10 | Discharge: 2024-05-10 | Disposition: A | Discharge status: Home / Self Care | Attending: Cardiology | Admitting: Cardiology

## 2024-05-10 ENCOUNTER — Other Ambulatory Visit: Payer: Self-pay

## 2024-05-10 ENCOUNTER — Encounter: Payer: Self-pay | Admitting: Cardiology

## 2024-05-10 ENCOUNTER — Encounter: Admission: RE | Disposition: A | Payer: Self-pay | Source: Home / Self Care | Attending: Cardiology

## 2024-05-10 DIAGNOSIS — I252 Old myocardial infarction: Secondary | ICD-10-CM | POA: Diagnosis not present

## 2024-05-10 DIAGNOSIS — I480 Paroxysmal atrial fibrillation: Secondary | ICD-10-CM | POA: Insufficient documentation

## 2024-05-10 DIAGNOSIS — I440 Atrioventricular block, first degree: Secondary | ICD-10-CM | POA: Diagnosis not present

## 2024-05-10 DIAGNOSIS — I4819 Other persistent atrial fibrillation: Secondary | ICD-10-CM

## 2024-05-10 DIAGNOSIS — I4891 Unspecified atrial fibrillation: Secondary | ICD-10-CM | POA: Diagnosis present

## 2024-05-10 DIAGNOSIS — Z95810 Presence of automatic (implantable) cardiac defibrillator: Secondary | ICD-10-CM | POA: Insufficient documentation

## 2024-05-10 DIAGNOSIS — Z7901 Long term (current) use of anticoagulants: Secondary | ICD-10-CM | POA: Diagnosis not present

## 2024-05-10 MED ORDER — PROPOFOL 10 MG/ML IV BOLUS
INTRAVENOUS | Status: DC | PRN
  Administered 2024-05-10: 50 mg via INTRAVENOUS

## 2024-05-10 MED ORDER — SODIUM CHLORIDE 0.9 % IV SOLN: INTRAVENOUS | Status: DC

## 2024-05-10 MED ORDER — PHENYLEPHRINE 80 MCG/ML (10ML) SYRINGE FOR IV PUSH (FOR BLOOD PRESSURE SUPPORT)
PREFILLED_SYRINGE | INTRAVENOUS | Status: DC | PRN
  Administered 2024-05-10: 160 ug via INTRAVENOUS

## 2024-05-10 NOTE — Progress Notes (Addendum)
 Dr. Ammon at bedside to assess patient for intermittent left sided chest pain. Dr. Ammon verbalized to patient that he was not too concerned about this, because he does not believe it is cardiac related due to its location.

## 2024-05-10 NOTE — Anesthesia Preprocedure Evaluation (Signed)
 "                                  Anesthesia Evaluation  Patient identified by MRN, date of birth, ID band Patient awake    Reviewed: Allergy & Precautions, NPO status , Patient's Chart, lab work & pertinent test results  History of Anesthesia Complications Negative for: history of anesthetic complications  Airway Mallampati: I       Dental  (+) Poor Dentition, Dental Advidsory Given   Pulmonary neg shortness of breath, sleep apnea , neg COPD, neg recent URI   Pulmonary exam normal breath sounds clear to auscultation       Cardiovascular hypertension, + angina (from a fib)  + CAD, + Past MI and +CHF (NICM, EF 40%)  (-) Cardiac Stents + dysrhythmias (a fib on Eliquis ) + pacemaker + Cardiac Defibrillator (-) Valvular Problems/Murmurs Rhythm:Irregular Rate:Normal  ECG 02/11/23: A-V dual-paced complexes w/ some inhibition  Myocardial perfusion 01/17/23:  1.  Moderate to severely reduced left ventricular function  2.  Global hypokinesis  3.  Mild inferior apical scar without significant ischemia   Echo 08/19/21:  MILD LV SYSTOLIC DYSFUNCTION  NORMAL RIGHT VENTRICULAR SYSTOLIC FUNCTION  MILD VALVULAR REGURGITATION  NO VALVULAR STENOSIS  *TDS - POOR SOUND TRANSMISSION  ESTIMATED LVEF 40%  Aortic: TRIVIAL AI  Mitral: MILD MR  Tricuspid: TRIVIAL TR  Pulmonic: TRIVIAL PI     Neuro/Psych negative neurological ROS     GI/Hepatic negative GI ROS, Neg liver ROS,,,  Endo/Other  diabetes (borderline)    Renal/GU negative Renal ROS     Musculoskeletal   Abdominal   Peds  Hematology negative hematology ROS (+)   Anesthesia Other Findings Cardiology note 04/11/23:  Chronic systolic CHF (congestive heart failure; EF 35% - 01/17/19)  Paroxysmal atrial fibrillation on eliquis  s/p cardioversion (02/2023) - followed by Dr. Ammon  Essential hypertension  Coronary artery disease, non-occlusive  ICD (implantable cardioverter-defibrillator) in place   SOB  (shortness of breath) on exertion  Mixed hyperlipidemia (LDL 64 - 03/22/23)  74 year old gentleman with nonischemic dilated cardiomyopathy, chronic systolic congestive heart failure, hospitalized with ventricular tachycardia, underwent catheter ablation for ventricular tachycardia at [facility] 08/01/2015. Patient has paroxysmal atrial fibrillation, on Eliquis  for stroke prevention, and sotalol  for rhythm control. 2D echocardiogram 08/19/2021 revealed LVEF 40%. Patient has essential hypertension, blood pressure well controlled on current BP medications. The patient had an episode of syncope on 5/29 while working in his garden. ICD interrogation shows a 3 second episode of VF in monitor zone and 15 episodes of NSVT since the last interrogation. In light of this and patient's known history of catheter ablation for VT, he was referred back to EP. Holter monitor revealed predominant sinus rhythm with mean heart rate 82 bpm, sinus heart rate range 58 to 129 bpm. Frequent premature ventricular contractions, and frequent brief atrial runs were observed. Lexiscan  Myoview 01/17/2023 revealed LVEF 28% with mild inferior scar without significant ischemia. Cardiac stress MRI is scheduled for 02/17/2023 and discussion was made about possible repeat VT ablation per Dr. Melchor. Patient returns today, complains of persistent elevated heart rate despite uptitrating metoprolol succinate. ECG reveals atrial flutter at 123 bpm. The patient underwent successful cardioversion on 02/11/2023 at which time sotalol  was increased to 120 mg twice daily and metoprolol succinate decreased to 50 mg daily. Post cardioversion ECG revealed atrial pacing with ventricular sensing, QT 463 ms, QTc 558 ms.  His symptoms have resolved on increased dose of sotalol . He is scheduled for AF catheter ablation 05/17/2023.   Plan: Continue current medications Continue sotalol  120 mg BID Continue Eliquis  for stroke prevention Continue heart healthy, low  sodium diet Catheter ablation 05/17/2023; follow up with EP after Follow up with Dr. Ammon in 3 months.    Reproductive/Obstetrics                              Anesthesia Physical Anesthesia Plan  ASA: 3  Anesthesia Plan: General   Post-op Pain Management:    Induction: Intravenous  PONV Risk Score and Plan: 2 and Propofol  infusion, TIVA and Treatment may vary due to age or medical condition  Airway Management Planned: Natural Airway and Nasal Cannula  Additional Equipment:   Intra-op Plan:   Post-operative Plan:   Informed Consent: I have reviewed the patients History and Physical, chart, labs and discussed the procedure including the risks, benefits and alternatives for the proposed anesthesia with the patient or authorized representative who has indicated his/her understanding and acceptance.       Plan Discussed with: CRNA  Anesthesia Plan Comments:          Anesthesia Quick Evaluation  "

## 2024-05-10 NOTE — Transfer of Care (Signed)
 Immediate Anesthesia Transfer of Care Note  Patient: Jay [name]  Procedure(s) Performed: CARDIOVERSION  Patient Location: Cath Lab  Anesthesia Type:General  Level of Consciousness: drowsy  Airway & Oxygen Therapy: Patient Spontanous Breathing and Patient connected to nasal cannula oxygen  Post-op Assessment: Report given to RN  Post vital signs: stable  Last Vitals:  Vitals Value Taken Time  BP 111/92 05/10/24 07:30  Temp    Pulse 75 05/10/24 07:39  Resp 17 05/10/24 07:39  SpO2 99 % 05/10/24 07:39    Last Pain:  Vitals:   05/10/24 0659  TempSrc: (P) Oral         Complications: No notable events documented.

## 2024-05-10 NOTE — Brief Op Note (Signed)
 [facility] Cardiology   05/10/2024                     7:41 AM  PATIENT:  Jay [name]    PRE-OPERATIVE DIAGNOSIS:  Cardioversion   Afib  POST-OPERATIVE DIAGNOSIS:  Same  PROCEDURE:  CARDIOVERSION  SURGEON:  Marsa Dooms, MD    ANESTHESIA:     PREOPERATIVE INDICATIONS:  [name] is a  74 y.o. male with a diagnosis of Cardioversion   Afib who failed conservative measures and elected for surgical management.    The risks benefits and alternatives were discussed with the patient preoperatively including but not limited to the risks of infection, bleeding, cardiopulmonary complications, the need for revision surgery, among others, and the patient was willing to proceed.   OPERATIVE PROCEDURE: The patient presented to special procedures in a fasting state.  ECG revealed atrial flutter with intermittent ventricular pacing at 102 bpm.  The patient received 50 mg of propofol .  Direct-current cardioversion was performed with 125 and 200 J with successful conversion to sinus rhythm.  Procedure ECG revealed atrial pacing at 82 bpm there were no periprocedural complications.

## 2024-05-11 ENCOUNTER — Ambulatory Visit
Admission: RE | Admit: 2024-05-11 | Discharge: 2024-05-11 | Disposition: A | Source: Ambulatory Visit | Attending: Family Medicine | Admitting: Family Medicine

## 2024-05-11 ENCOUNTER — Encounter: Payer: Self-pay | Admitting: Cardiology

## 2024-05-11 DIAGNOSIS — R911 Solitary pulmonary nodule: Secondary | ICD-10-CM | POA: Diagnosis present

## 2024-05-14 NOTE — Anesthesia Postprocedure Evaluation (Signed)
"   Anesthesia Post Note  Patient: Jay [name]  Procedure(s) Performed: CARDIOVERSION  Patient location during evaluation: Specials Recovery Anesthesia Type: General Level of consciousness: awake and alert Pain management: pain level controlled Vital Signs Assessment: post-procedure vital signs reviewed and stable Respiratory status: spontaneous breathing, nonlabored ventilation, respiratory function stable and patient connected to nasal cannula oxygen Cardiovascular status: blood pressure returned to baseline and stable Postop Assessment: no apparent nausea or vomiting Anesthetic complications: no   No notable events documented.   Last Vitals:  Vitals:   05/10/24 0815 05/10/24 0848  BP: 116/83 132/86  Pulse: 75 73  Resp: 15 15  Temp:  37.1 C  SpO2: 99% 98%    Last Pain:  Vitals:   05/10/24 0848  TempSrc: Temporal  PainSc: 0-No pain                 Prentice Murphy      "
